# Patient Record
Sex: Male | Born: 1990 | Race: Black or African American | Hispanic: No | Marital: Single | Smoking: Former smoker
Health system: Southern US, Community
[De-identification: ages and names within clinical notes are randomized; demographics above are authoritative.]

## PROBLEM LIST (undated history)

## (undated) DIAGNOSIS — Z789 Other specified health status: Secondary | ICD-10-CM

## (undated) DIAGNOSIS — Z5900 Homelessness unspecified: Secondary | ICD-10-CM

## (undated) DIAGNOSIS — W19XXXA Unspecified fall, initial encounter: Secondary | ICD-10-CM

## (undated) HISTORY — PX: TONSILLECTOMY: SUR1361

---
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
## 2009-05-18 ENCOUNTER — Emergency Department (HOSPITAL_COMMUNITY): Admission: EM | Admit: 2009-05-18 | Discharge: 2009-05-18 | Payer: Self-pay | Admitting: Emergency Medicine

## 2010-05-27 ENCOUNTER — Emergency Department (HOSPITAL_COMMUNITY)
Admission: EM | Admit: 2010-05-27 | Discharge: 2010-05-27 | Payer: Self-pay | Source: Home / Self Care | Admitting: Family Medicine

## 2011-08-29 ENCOUNTER — Encounter (HOSPITAL_COMMUNITY): Payer: Self-pay | Admitting: Emergency Medicine

## 2011-08-29 ENCOUNTER — Emergency Department (HOSPITAL_COMMUNITY)
Admission: EM | Admit: 2011-08-29 | Discharge: 2011-08-29 | Disposition: A | Discharge status: Home / Self Care | Payer: Self-pay | Attending: Emergency Medicine | Admitting: Emergency Medicine

## 2011-08-29 ENCOUNTER — Emergency Department (HOSPITAL_COMMUNITY): Payer: Self-pay

## 2011-08-29 DIAGNOSIS — R05 Cough: Secondary | ICD-10-CM | POA: Insufficient documentation

## 2011-08-29 DIAGNOSIS — J069 Acute upper respiratory infection, unspecified: Secondary | ICD-10-CM

## 2011-08-29 DIAGNOSIS — J3489 Other specified disorders of nose and nasal sinuses: Secondary | ICD-10-CM | POA: Insufficient documentation

## 2011-08-29 DIAGNOSIS — F17200 Nicotine dependence, unspecified, uncomplicated: Secondary | ICD-10-CM | POA: Insufficient documentation

## 2011-08-29 DIAGNOSIS — R059 Cough, unspecified: Secondary | ICD-10-CM | POA: Insufficient documentation

## 2011-08-29 MED ORDER — IBUPROFEN 800 MG PO TABS
800.0000 mg | ORAL_TABLET | Freq: Once | ORAL | Status: AC
  Administered 2011-08-29: 800 mg via ORAL
  Filled 2011-08-29: qty 1

## 2011-08-29 MED ORDER — AZITHROMYCIN 250 MG PO TABS: 250.0000 mg | ORAL_TABLET | Freq: Every day | ORAL | Status: AC

## 2011-08-29 NOTE — ED Notes (Signed)
Pt c/o cough x4days. States he coughs so much he vomits. Vomited 1x today. Pos hx for smoking cigarettes and marijuana.

## 2011-08-29 NOTE — ED Notes (Signed)
Pt d/c home in NAD. Pt verbalized understanding of d/c instructions.

## 2011-08-29 NOTE — ED Notes (Signed)
Pt c/o productive cough (green) x's 4 days.  St's he coughs until he vomits

## 2011-08-29 NOTE — ED Provider Notes (Signed)
History     CSN: [id number]  Arrival date & time 08/29/11  1742   None     Chief Complaint  Patient presents with  . Cough    (Consider location/radiation/quality/duration/timing/severity/associated sxs/prior treatment) Patient is a 21 y.o. male presenting with cough. The history is provided by the patient. No language interpreter was used.  Cough This is a new problem. Associated symptoms include rhinorrhea. Pertinent negatives include no chest pain, no chills, no ear congestion, no ear pain, no headaches, no sore throat, no shortness of breath, no wheezing and no eye redness. His past medical history is significant for pneumonia. His past medical history does not include bronchitis, bronchiectasis, COPD, emphysema or asthma.  Lives in a transitional house with 8 other guys and one of them is sick also.  States he had nausea vomiting and diarrhea last week. States this week he's had a productive cough x4 days. States he also has a runny nose and sneezing. Denies neck pain headache. Afebrile. Patient is a smoker. Prior pneumonia. Post tussive emesis.   History reviewed. No pertinent past medical history.  History reviewed. No pertinent past surgical history.  No family history on file.  History  Substance Use Topics  . Smoking status: Current Everyday Smoker  . Smokeless tobacco: Not on file  . Alcohol Use: No      Review of Systems  Constitutional: Negative for chills.  HENT: Positive for rhinorrhea. Negative for ear pain and sore throat.   Eyes: Negative for redness.  Respiratory: Positive for cough. Negative for chest tightness, shortness of breath and wheezing.   Cardiovascular: Negative for chest pain.  Neurological: Negative for headaches.    Allergies  Review of patient's allergies indicates no known allergies.  Home Medications  No current outpatient prescriptions on file.  BP 131/77  Pulse 81  Temp(Src) 98.6 F (37 C) (Oral)  Resp 16  SpO2  100%  Physical Exam  Nursing note and vitals reviewed. Constitutional: He is oriented to person, place, and time. He appears well-developed and well-nourished.  HENT:  Head: Normocephalic.  Eyes: Conjunctivae and EOM are normal. Pupils are equal, round, and reactive to light.  Neck: Normal range of motion. Neck supple.  Cardiovascular: Normal rate.   Pulmonary/Chest: Effort normal and breath sounds normal. No respiratory distress. He has no wheezes.  Abdominal: Soft.  Musculoskeletal: Normal range of motion.  Neurological: He is alert and oriented to person, place, and time.  Skin: Skin is warm and dry.  Psychiatric: He has a normal mood and affect.    ED Course  Procedures (including critical care time)  Labs Reviewed - No data to display Dg Chest 2 View  08/29/2011  *RADIOLOGY REPORT*  Clinical Data: History of cough, vomiting and fever.  CHEST - 2 VIEW  Comparison: Chest x-ray 05/18/2009.  Findings: There is a ill-defined opacity at the base of the left hemithorax projecting somewhat anteriorly on the lateral view (however, it appears to be immediately posterior to the left major fissure).  This corresponds with an area of airspace consolidation from remote prior chest x-ray 05/18/2009.  Lungs are otherwise clear.  No definite pleural effusions.  Pulmonary vasculature is normal.  Cardiomediastinal silhouette is within normal limits.  IMPRESSION: 1.  Ill-defined opacity projecting at the base of the left hemithorax likely within the anterobasal segment of the left lower lobe.  This could represent an area of scarring from prior pneumonia, however, an acute infection in this region could have a similar appearance.  Clinical correlation is recommended. Additionally, repeat radiographs in 2-3 months are recommended to evaluate for stability or resolution of this finding (to exclude a central obstructing lesion).  Original Report Authenticated By: Florencia Reasons, M.D.     No diagnosis  found.    MDM  z-pack rx given if not better in 1-2 days for cough and upper respiratory symptoms.  pmh pneumonia scarring on the chest x-ray. Afebrile. Return if worse. Nopcp.         Jethro Bastos, NP 08/30/11 1149

## 2011-08-29 NOTE — Discharge Instructions (Signed)
George Harrison your lungs sound clear.  If you start running a fever or the cough continues for several days then get the rx filled and take all the the medication as directed.  Gargle with warm salt water for your throat irritation.  Take benadryl at night for your runny nose.  Take ibuprofen 600mg  every 4-6 hours x 24 hours. Follow up with a pcp of your choice or one  from the list below as needed.  Return for severe SOB or chest pain   RESOURCE GUIDE  Dental Problems  Patients with Medicaid: [facility] [phone] [address]                                           [id number] [employer] Phone:  [phone]                                                  Phone:  [phone]  If unable to pay or uninsured, contact:  [facility] or [facility] to become qualified for the adult dental clinic.  Chronic Pain Problems Contact [facility]  [phone] Patients need to be referred by their primary care doctor.  Insufficient Money for Medicine Contact [employer] Way:  call "211" or [facility] 715-238-8357.  No Primary Care Doctor Call [facility]  [phone] Other agencies that provide inexpensive medical care    [facility]  [phone]    [facility]  [phone]    [facility]  [phone]    [facility]  [phone]    [facility]anned [facility]  [phone]    [facility]  [phone]  Psychological Services [facility]  [phone] [facility]  [phone] [facility]   [phone] (emergency services 815-579-7126)  Substance Abuse Resources [facility]  (602) 683-8470 [facility] 708-281-4981 [facility] [phone] Floydene Flock 937-194-3230 Residential & Outpatient Substance Abuse Program  7850385643  Abuse/Neglect [facility] Child Abuse Hotline (603)512-1376 [facility] Child Abuse Hotline  878-651-3035 (After Hours)  Emergency Shelter [facility] 256-370-0570  Maternity Homes Room at the [place] [facility] [place] [phone] [phone] [facility] ([phone]  MRSA Hotline #:   380-795-4853    [facility] Resources  [facility] [place]     [employer]                          [facility] Dept. [address] [place]                       [address]      [address]  Patrecia Pace  Michell Heinrich Phone:  [phone]                                   Phone:  [phone]                 Phone:  [phone]  [facility] Mental Health Phone:  [phone]  [facility] Child Abuse Hotline 385-846-7097 408-201-5481 (After Hours)   Cough, Adult  A cough is a reflex that helps clear your throat and airways. It can help heal the body or may be a reaction to an irritated airway. A cough may only last 2 or 3 weeks (acute) or may last more than 8 weeks (chronic).  CAUSES Acute cough:  Viral or bacterial infections.  Chronic cough:  Infections.   Allergies.   Asthma.   Post-nasal drip.   Smoking.   Heartburn or acid reflux.   Some medicines.   Chronic lung problems (COPD).   Cancer.  SYMPTOMS   Cough.   Fever.   Chest pain.   Increased breathing rate.   High-pitched whistling sound when breathing (wheezing).   Colored mucus that you cough up (sputum).  TREATMENT   A bacterial cough may be treated with antibiotic medicine.   A viral cough must run its course and will not respond to antibiotics.   Your caregiver may recommend other treatments if you have a chronic cough.  HOME CARE INSTRUCTIONS   Only take over-the-counter or prescription medicines for pain, discomfort, or fever as directed by your caregiver. Use cough suppressants only as directed by your caregiver.   Use a cold steam vaporizer or  humidifier in your bedroom or home to help loosen secretions.   Sleep in a semi-upright position if your cough is worse at night.   Rest as needed.   Stop smoking if you smoke.  SEEK IMMEDIATE MEDICAL CARE IF:   You have pus in your sputum.   Your cough starts to worsen.   You cannot control your cough with suppressants and are losing sleep.   You begin coughing up blood.   You have difficulty breathing.   You develop pain which is getting worse or is uncontrolled with medicine.   You have a fever.  MAKE SURE YOU:   Understand these instructions.   Will watch your condition.   Will get help right away if you are not doing well or get worse.  Document Released: 11/25/2010 Document Revised: 05/18/2011 Document Reviewed: 11/25/2010 [facility] Patient Information 2012 [place], [place].Cough, Adult  A cough is a reflex that helps clear your throat and airways. It can help heal the body or may be a reaction to an irritated airway. A cough may only last 2 or 3 weeks (acute) or may last more than 8 weeks (chronic).  CAUSES Acute cough:  Viral or bacterial infections.  Chronic cough:  Infections.   Allergies.   Asthma.   Post-nasal drip.   Smoking.   Heartburn or acid reflux.   Some medicines.   Chronic lung problems (COPD).   Cancer.  SYMPTOMS   Cough.   Fever.   Chest pain.   Increased breathing rate.   High-pitched whistling sound when breathing (wheezing).   Colored mucus that you cough up (sputum).  TREATMENT   A bacterial cough may be treated with antibiotic medicine.   A viral cough must run its course and will not respond to antibiotics.   Your  caregiver may recommend other treatments if you have a chronic cough.  HOME CARE INSTRUCTIONS   Only take over-the-counter or prescription medicines for pain, discomfort, or fever as directed by your caregiver. Use cough suppressants only as directed by your caregiver.   Use a cold steam vaporizer or  humidifier in your bedroom or home to help loosen secretions.   Sleep in a semi-upright position if your cough is worse at night.   Rest as needed.   Stop smoking if you smoke.  SEEK IMMEDIATE MEDICAL CARE IF:   You have pus in your sputum.   Your cough starts to worsen.   You cannot control your cough with suppressants and are losing sleep.   You begin coughing up blood.   You have difficulty breathing.   You develop pain which is getting worse or is uncontrolled with medicine.   You have a fever.  MAKE SURE YOU:   Understand these instructions.   Will watch your condition.   Will get help right away if you are not doing well or get worse.  Document Released: 11/25/2010 Document Revised: 05/18/2011 Document Reviewed: 11/25/2010 [facility] Patient Information 2012 [place], [place].Cough, Adult  A cough is a reflex that helps clear your throat and airways. It can help heal the body or may be a reaction to an irritated airway. A cough may only last 2 or 3 weeks (acute) or may last more than 8 weeks (chronic).  CAUSES Acute cough:  Viral or bacterial infections.  Chronic cough:  Infections.   Allergies.   Asthma.   Post-nasal drip.   Smoking.   Heartburn or acid reflux.   Some medicines.   Chronic lung problems (COPD).   Cancer.  SYMPTOMS   Cough.   Fever.   Chest pain.   Increased breathing rate.   High-pitched whistling sound when breathing (wheezing).   Colored mucus that you cough up (sputum).  TREATMENT   A bacterial cough may be treated with antibiotic medicine.   A viral cough must run its course and will not respond to antibiotics.   Your caregiver may recommend other treatments if you have a chronic cough.  HOME CARE INSTRUCTIONS   Only take over-the-counter or prescription medicines for pain, discomfort, or fever as directed by your caregiver. Use cough suppressants only as directed by your caregiver.   Use a cold steam vaporizer or  humidifier in your bedroom or home to help loosen secretions.   Sleep in a semi-upright position if your cough is worse at night.   Rest as needed.   Stop smoking if you smoke.  SEEK IMMEDIATE MEDICAL CARE IF:   You have pus in your sputum.   Your cough starts to worsen.   You cannot control your cough with suppressants and are losing sleep.   You begin coughing up blood.   You have difficulty breathing.   You develop pain which is getting worse or is uncontrolled with medicine.   You have a fever.  MAKE SURE YOU:   Understand these instructions.   Will watch your condition.   Will get help right away if you are not doing well or get worse.  Document Released: 11/25/2010 Document Revised: 05/18/2011 Document Reviewed: 11/25/2010 [facility] Patient Information 2012 [place], [place].Cough, Adult  A cough is a reflex that helps clear your throat and airways. It can help heal the body or may be a reaction to an irritated airway. A cough may only last 2 or 3 weeks (acute) or  may last more than 8 weeks (chronic).  CAUSES Acute cough:  Viral or bacterial infections.  Chronic cough:  Infections.   Allergies.   Asthma.   Post-nasal drip.   Smoking.   Heartburn or acid reflux.   Some medicines.   Chronic lung problems (COPD).   Cancer.  SYMPTOMS   Cough.   Fever.   Chest pain.   Increased breathing rate.   High-pitched whistling sound when breathing (wheezing).   Colored mucus that you cough up (sputum).  TREATMENT   A bacterial cough may be treated with antibiotic medicine.   A viral cough must run its course and will not respond to antibiotics.   Your caregiver may recommend other treatments if you have a chronic cough.  HOME CARE INSTRUCTIONS   Only take over-the-counter or prescription medicines for pain, discomfort, or fever as directed by your caregiver. Use cough suppressants only as directed by your caregiver.   Use a cold steam vaporizer or  humidifier in your bedroom or home to help loosen secretions.   Sleep in a semi-upright position if your cough is worse at night.   Rest as needed.   Stop smoking if you smoke.  SEEK IMMEDIATE MEDICAL CARE IF:   You have pus in your sputum.   Your cough starts to worsen.   You cannot control your cough with suppressants and are losing sleep.   You begin coughing up blood.   You have difficulty breathing.   You develop pain which is getting worse or is uncontrolled with medicine.   You have a fever.  MAKE SURE YOU:   Understand these instructions.   Will watch your condition.   Will get help right away if you are not doing well or get worse.  Document Released: 11/25/2010 Document Revised: 05/18/2011 Document Reviewed: 11/25/2010 [facility] Patient Information 2012 [place], [place].

## 2011-08-30 NOTE — ED Provider Notes (Signed)
Medical screening examination/treatment/procedure(s) were performed by non-physician practitioner and as supervising physician I was immediately available for consultation/collaboration.  Enda Santo, MD 08/30/11 1510 

## 2012-07-14 ENCOUNTER — Emergency Department (HOSPITAL_COMMUNITY)
Admission: EM | Admit: 2012-07-14 | Discharge: 2012-07-14 | Disposition: A | Discharge status: Home / Self Care | Payer: Self-pay | Attending: Emergency Medicine | Admitting: Emergency Medicine

## 2012-07-14 ENCOUNTER — Encounter (HOSPITAL_COMMUNITY): Payer: Self-pay | Admitting: Nurse Practitioner

## 2012-07-14 DIAGNOSIS — R112 Nausea with vomiting, unspecified: Secondary | ICD-10-CM

## 2012-07-14 DIAGNOSIS — F17200 Nicotine dependence, unspecified, uncomplicated: Secondary | ICD-10-CM | POA: Insufficient documentation

## 2012-07-14 LAB — COMPREHENSIVE METABOLIC PANEL
Alkaline Phosphatase: 56 U/L (ref 39–117)
BUN: 11 mg/dL (ref 6–23)
CO2: 30 mEq/L (ref 19–32)
Chloride: 101 mEq/L (ref 96–112)
Creatinine, Ser: 0.69 mg/dL (ref 0.50–1.35)
GFR calc non Af Amer: >90 mL/min (ref >90)
Glucose, Bld: 95 mg/dL (ref 70–99)
Potassium: 4 mEq/L (ref 3.5–5.1)
Total Bilirubin: 0.7 mg/dL (ref 0.3–1.2)

## 2012-07-14 LAB — LIPASE, BLOOD: Lipase: 21 U/L (ref 11–59)

## 2012-07-14 LAB — CBC WITH DIFFERENTIAL/PLATELET
HCT: 47.3 % (ref 39.0–52.0)
Hemoglobin: 16.2 g/dL (ref 13.0–17.0)
Lymphocytes Relative: 5 % — ABNORMAL LOW (ref 12–46)
Lymphs Abs: 0.6 10*3/uL — ABNORMAL LOW (ref 0.7–4.0)
MCHC: 34.2 g/dL (ref 30.0–36.0)
Monocytes Absolute: 0.6 10*3/uL (ref 0.1–1.0)
Monocytes Relative: 6 % (ref 3–12)
Neutro Abs: 9.6 10*3/uL — ABNORMAL HIGH (ref 1.7–7.7)
RBC: 5.31 MIL/uL (ref 4.22–5.81)
WBC: 10.8 10*3/uL — ABNORMAL HIGH (ref 4.0–10.5)

## 2012-07-14 MED ORDER — PROMETHAZINE HCL 25 MG PO TABS: 25.0000 mg | ORAL_TABLET | Freq: Four times a day (QID) | ORAL | Status: DC | PRN

## 2012-07-14 MED ORDER — ONDANSETRON HCL 4 MG/2ML IJ SOLN
4.0000 mg | Freq: Once | INTRAMUSCULAR | Status: AC
  Administered 2012-07-14: 4 mg via INTRAVENOUS
  Filled 2012-07-14: qty 8

## 2012-07-14 NOTE — ED Notes (Signed)
Patient state that he has been vomiting since 0400 this AM.  States that he vomited  5 or 6 times so far.  Nausea is constant. Patient states that initially his vomitus contained food, but now it is bilious. Denies diarrhea.

## 2012-07-14 NOTE — ED Notes (Signed)
Patient to [facility] Ed with C/o Nausea and vomiting since 0400.

## 2012-07-14 NOTE — ED Provider Notes (Signed)
History     CSN: [id number]  Arrival date & time 07/14/12  1429   First MD Initiated Contact with Patient 07/14/12 1504      Chief Complaint  Patient presents with  . Nausea    (Consider location/radiation/quality/duration/timing/severity/associated sxs/prior treatment) The history is provided by the patient and medical records.    George Harrison is a 22 y.o. male  with no major medical history of presents to the Emergency Department complaining of gradual, persistent, gradually resolving nausea and vomiting onset 4 AM.  Patient states she has not had much food in the hospital last 2 weeks and last night his sister arrived with lots of food. He states he ate pizza, and kidney beans and Pinto beans along with many other things.  He states he ate until his stomach hurt. He then went to bed and awoke at 4 AM with nausea and vomiting. Associated symptoms include feeling hot.  Nothing makes it better and nothing makes it worse.  Pt denies fever, chills, headache, neck pain, chest pain, shortness of breath, abdominal pain, diarrhea, weakness, dizziness, syncope, dysuria, hematuria, penile discharge.     History reviewed. No pertinent past medical history.  History reviewed. No pertinent past surgical history.  History reviewed. No pertinent family history.  History  Substance Use Topics  . Smoking status: Current Every Day Smoker  . Smokeless tobacco: Not on file  . Alcohol Use: Yes      Review of Systems  Constitutional: Negative for fever, diaphoresis, appetite change, fatigue and unexpected weight change.  HENT: Negative for mouth sores, trouble swallowing, neck pain and neck stiffness.   Respiratory: Negative for cough, chest tightness, shortness of breath, wheezing and stridor.   Cardiovascular: Negative for chest pain and palpitations.  Gastrointestinal: Positive for nausea and vomiting. Negative for abdominal pain, diarrhea, constipation, blood in stool, abdominal distention  and rectal pain.  Genitourinary: Negative for dysuria, urgency, frequency, hematuria, flank pain and difficulty urinating.  Musculoskeletal: Negative for back pain.  Skin: Negative for rash.  Neurological: Negative for weakness.  Hematological: Negative for adenopathy.  Psychiatric/Behavioral: Negative for confusion.  All other systems reviewed and are negative.    Allergies  Review of patient's allergies indicates no known allergies.  Home Medications   Current Outpatient Rx  Name  Route  Sig  Dispense  Refill  . PROMETHAZINE HCL 25 MG PO TABS   Oral   Take 1 tablet (25 mg total) by mouth every 6 (six) hours as needed for nausea.   12 tablet   0     BP 132/72  Pulse 61  Temp 98.9 F (37.2 C) (Oral)  Resp 16  SpO2 100%  Physical Exam  Nursing note and vitals reviewed. Constitutional: He is oriented to person, place, and time. He appears well-developed and well-nourished.  HENT:  Head: Normocephalic and atraumatic.  Mouth/Throat: Oropharynx is clear and moist.  Eyes: Conjunctivae normal and EOM are normal. Pupils are equal, round, and reactive to light. No scleral icterus.  Neck: Normal range of motion.  Cardiovascular: Normal rate, regular rhythm, normal heart sounds and intact distal pulses.  Exam reveals no gallop and no friction rub.   No murmur heard. Pulmonary/Chest: Effort normal and breath sounds normal. No respiratory distress. He has no wheezes. He has no rales. He exhibits no tenderness.  Abdominal: Soft. Normal appearance and bowel sounds are normal. He exhibits no distension and no mass. There is no tenderness. There is no rigidity, no rebound, no guarding,  no CVA tenderness, no tenderness at McBurney's point and negative Murphy's sign.  Musculoskeletal: Normal range of motion. He exhibits no edema and no tenderness.  Lymphadenopathy:    He has no cervical adenopathy.  Neurological: He is alert and oriented to person, place, and time. He exhibits normal  muscle tone. Coordination normal.  Skin: Skin is warm and dry. No rash noted. No erythema.  Psychiatric: He has a normal mood and affect.    ED Course  Procedures (including critical care time)  Labs Reviewed  CBC WITH DIFFERENTIAL - Abnormal; Notable for the following:    WBC 10.8 (*)     Neutrophils Relative 89 (*)     Neutro Abs 9.6 (*)     Lymphocytes Relative 5 (*)     Lymphs Abs 0.6 (*)     All other components within normal limits  COMPREHENSIVE METABOLIC PANEL  LIPASE, BLOOD   No results found.   1. Nausea and vomiting in adult       MDM  [name] presents with nausea and vomiting.  Patient given Zofran and fluid bolus. Patient afebrile, alert and oriented, NAD, nontoxic, nonseptic appearing. Patient normotensive.  Likely viral gastritis versus significant overeating. Will obtain labs to assess for other etiologies such as pancreatitis.    Labs unremarkable. Patient vitals remained stable here in the department.  Fluid bolus given.  Labs and vitals reviewed. Pt requesting d/c home.    BP 132/72  Pulse 61  Temp 98.9 F (37.2 C) (Oral)  Resp 16  SpO2 100%  Patient with symptoms consistent with viral gastritis.  Vitals are stable, no fever.  Patient is nontoxic, nonseptic appearing, in no apparent distress.  Patient does not meet the SIRS or Sepsis criteria.  Pt's symptoms have been managed in the department; fluid bolus given. No signs of dehydration, tolerating PO fluids > 6 oz.  Lungs are clear.  No focal abdominal pain, no peritoneal signs, no concern for appendicitis, cholecystitis, pancreatitis, ruptured viscus, UTI, kidney stone, or any other abdominal etiology.  Supportive therapy indicated with return if symptoms worsen.  Patient counseled.  I have also discussed reasons to return immediately to the ER.  Patient expresses understanding and agrees with plan.  1. Medications: Phenergan, usual home medications  2. Treatment: rest, drink plenty of fluids,  advance diet slowly  3. Follow Up: Please followup with your primary doctor for discussion of your diagnoses and further evaluation after today's visit; if you do not have a primary care doctor use the resource guide provided to find one;   Dierdre Forth, PA-C 07/14/12 1725

## 2012-07-15 NOTE — ED Provider Notes (Signed)
Medical screening examination/treatment/procedure(s) were performed by non-physician practitioner and as supervising physician I was immediately available for consultation/collaboration.  Doug Sou, MD 07/15/12 [phone]

## 2012-10-02 ENCOUNTER — Encounter (HOSPITAL_COMMUNITY): Payer: Self-pay | Admitting: *Deleted

## 2012-10-02 ENCOUNTER — Inpatient Hospital Stay (HOSPITAL_COMMUNITY): Payer: Self-pay

## 2012-10-02 ENCOUNTER — Emergency Department (HOSPITAL_COMMUNITY): Payer: Self-pay

## 2012-10-02 ENCOUNTER — Encounter (HOSPITAL_COMMUNITY): Admission: EM | Disposition: A | Discharge status: Home / Self Care | Payer: Self-pay | Source: Home / Self Care

## 2012-10-02 ENCOUNTER — Inpatient Hospital Stay (HOSPITAL_COMMUNITY): Payer: Self-pay | Admitting: *Deleted

## 2012-10-02 ENCOUNTER — Inpatient Hospital Stay (HOSPITAL_COMMUNITY)
Admission: EM | Admit: 2012-10-02 | Discharge: 2012-10-11 | DRG: 493 | Disposition: A | Discharge status: Home / Self Care | Payer: MEDICAID | Attending: Surgery | Admitting: Surgery

## 2012-10-02 DIAGNOSIS — D62 Acute posthemorrhagic anemia: Secondary | ICD-10-CM

## 2012-10-02 DIAGNOSIS — R7309 Other abnormal glucose: Secondary | ICD-10-CM | POA: Diagnosis present

## 2012-10-02 DIAGNOSIS — S3210XA Unspecified fracture of sacrum, initial encounter for closed fracture: Secondary | ICD-10-CM

## 2012-10-02 DIAGNOSIS — S82899A Other fracture of unspecified lower leg, initial encounter for closed fracture: Secondary | ICD-10-CM

## 2012-10-02 DIAGNOSIS — S329XXA Fracture of unspecified parts of lumbosacral spine and pelvis, initial encounter for closed fracture: Secondary | ICD-10-CM

## 2012-10-02 DIAGNOSIS — S82871A Displaced pilon fracture of right tibia, initial encounter for closed fracture: Secondary | ICD-10-CM

## 2012-10-02 DIAGNOSIS — E871 Hypo-osmolality and hyponatremia: Secondary | ICD-10-CM | POA: Diagnosis not present

## 2012-10-02 DIAGNOSIS — F17200 Nicotine dependence, unspecified, uncomplicated: Secondary | ICD-10-CM | POA: Diagnosis present

## 2012-10-02 DIAGNOSIS — S32509A Unspecified fracture of unspecified pubis, initial encounter for closed fracture: Secondary | ICD-10-CM | POA: Diagnosis present

## 2012-10-02 DIAGNOSIS — Y9289 Other specified places as the place of occurrence of the external cause: Secondary | ICD-10-CM

## 2012-10-02 DIAGNOSIS — S32029A Unspecified fracture of second lumbar vertebra, initial encounter for closed fracture: Secondary | ICD-10-CM

## 2012-10-02 DIAGNOSIS — S82872A Displaced pilon fracture of left tibia, initial encounter for closed fracture: Secondary | ICD-10-CM

## 2012-10-02 DIAGNOSIS — S92009A Unspecified fracture of unspecified calcaneus, initial encounter for closed fracture: Secondary | ICD-10-CM | POA: Diagnosis present

## 2012-10-02 DIAGNOSIS — F1210 Cannabis abuse, uncomplicated: Secondary | ICD-10-CM | POA: Diagnosis present

## 2012-10-02 DIAGNOSIS — S32599A Other specified fracture of unspecified pubis, initial encounter for closed fracture: Secondary | ICD-10-CM

## 2012-10-02 DIAGNOSIS — W19XXXA Unspecified fall, initial encounter: Secondary | ICD-10-CM

## 2012-10-02 DIAGNOSIS — S322XXA Fracture of coccyx, initial encounter for closed fracture: Secondary | ICD-10-CM | POA: Diagnosis present

## 2012-10-02 DIAGNOSIS — S32009A Unspecified fracture of unspecified lumbar vertebra, initial encounter for closed fracture: Secondary | ICD-10-CM

## 2012-10-02 DIAGNOSIS — S32039A Unspecified fracture of third lumbar vertebra, initial encounter for closed fracture: Secondary | ICD-10-CM

## 2012-10-02 DIAGNOSIS — S82209A Unspecified fracture of shaft of unspecified tibia, initial encounter for closed fracture: Principal | ICD-10-CM | POA: Diagnosis present

## 2012-10-02 DIAGNOSIS — W102XXA Fall (on)(from) incline, initial encounter: Secondary | ICD-10-CM

## 2012-10-02 DIAGNOSIS — S92109A Unspecified fracture of unspecified talus, initial encounter for closed fracture: Secondary | ICD-10-CM | POA: Diagnosis present

## 2012-10-02 DIAGNOSIS — S32019A Unspecified fracture of first lumbar vertebra, initial encounter for closed fracture: Secondary | ICD-10-CM

## 2012-10-02 DIAGNOSIS — S92101A Unspecified fracture of right talus, initial encounter for closed fracture: Secondary | ICD-10-CM

## 2012-10-02 DIAGNOSIS — W1789XA Other fall from one level to another, initial encounter: Secondary | ICD-10-CM | POA: Diagnosis present

## 2012-10-02 HISTORY — DX: Other specified health status: Z78.9

## 2012-10-02 HISTORY — DX: Unspecified fall, initial encounter: W19.XXXA

## 2012-10-02 HISTORY — PX: EXTERNAL FIXATION LEG: SHX1549

## 2012-10-02 LAB — CBC
Hemoglobin: 14.8 g/dL (ref 13.0–17.0)
MCH: 30.8 pg (ref 26.0–34.0)
MCV: 85.2 fL (ref 78.0–100.0)
Platelets: 176 10*3/uL (ref 150–400)
RBC: 4.81 MIL/uL (ref 4.22–5.81)
WBC: 15.2 10*3/uL — ABNORMAL HIGH (ref 4.0–10.5)

## 2012-10-02 LAB — COMPREHENSIVE METABOLIC PANEL
AST: 70 U/L — ABNORMAL HIGH (ref 0–37)
Albumin: 4.2 g/dL (ref 3.5–5.2)
BUN: 11 mg/dL (ref 6–23)
Calcium: 9.6 mg/dL (ref 8.4–10.5)
Creatinine, Ser: 0.75 mg/dL (ref 0.50–1.35)
GFR calc non Af Amer: >90 mL/min (ref >90)
Total Bilirubin: 0.5 mg/dL (ref 0.3–1.2)

## 2012-10-02 LAB — CDS SEROLOGY

## 2012-10-02 LAB — POCT I-STAT, CHEM 8
BUN: 11 mg/dL (ref 6–23)
Calcium, Ion: 1.08 mmol/L — ABNORMAL LOW (ref 1.12–1.23)
Creatinine, Ser: 0.7 mg/dL (ref 0.50–1.35)
Glucose, Bld: 137 mg/dL — ABNORMAL HIGH (ref 70–99)
Hemoglobin: 16 g/dL (ref 13.0–17.0)
TCO2: 21 mmol/L (ref 0–100)

## 2012-10-02 LAB — PROTIME-INR
INR: 1.2 (ref 0.00–1.49)
Prothrombin Time: 15 seconds (ref 11.6–15.2)

## 2012-10-02 SURGERY — EXTERNAL FIXATION, LOWER EXTREMITY
Anesthesia: General | Site: Ankle | Laterality: Bilateral | Wound class: Clean

## 2012-10-02 MED ORDER — PANTOPRAZOLE SODIUM 40 MG IV SOLR
40.0000 mg | Freq: Every day | INTRAVENOUS | Status: DC
  Filled 2012-10-02 (×9): qty 40

## 2012-10-02 MED ORDER — FENTANYL CITRATE 0.05 MG/ML IJ SOLN
INTRAMUSCULAR | Status: DC | PRN
  Administered 2012-10-02: 50 ug via INTRAVENOUS

## 2012-10-02 MED ORDER — ONDANSETRON HCL 4 MG/2ML IJ SOLN
INTRAMUSCULAR | Status: DC | PRN
  Administered 2012-10-02: 4 mg via INTRAVENOUS

## 2012-10-02 MED ORDER — MORPHINE SULFATE 2 MG/ML IJ SOLN
2.0000 mg | INTRAMUSCULAR | Status: DC | PRN
  Administered 2012-10-02 – 2012-10-03 (×2): 2 mg via INTRAVENOUS
  Filled 2012-10-02 (×2): qty 1

## 2012-10-02 MED ORDER — NALOXONE HCL 0.4 MG/ML IJ SOLN: 0.4000 mg | INTRAMUSCULAR | Status: DC | PRN

## 2012-10-02 MED ORDER — MORPHINE SULFATE (PF) 1 MG/ML IV SOLN
INTRAVENOUS | Status: DC
  Administered 2012-10-03: 12 mg via INTRAVENOUS
  Administered 2012-10-03: 10 mg via INTRAVENOUS
  Administered 2012-10-03: 10.5 mg via INTRAVENOUS
  Administered 2012-10-03: 6 mg via INTRAVENOUS
  Administered 2012-10-03: 02:00:00 via INTRAVENOUS
  Administered 2012-10-03: 6 mg via INTRAVENOUS
  Administered 2012-10-03 – 2012-10-04 (×2): via INTRAVENOUS
  Administered 2012-10-04: 7.5 mg via INTRAVENOUS
  Administered 2012-10-04: 9 mg via INTRAVENOUS
  Filled 2012-10-02 (×4): qty 25

## 2012-10-02 MED ORDER — MORPHINE SULFATE 4 MG/ML IJ SOLN
6.0000 mg | Freq: Once | INTRAMUSCULAR | Status: AC
  Administered 2012-10-02: 6 mg via INTRAVENOUS
  Filled 2012-10-02: qty 2

## 2012-10-02 MED ORDER — IOHEXOL 300 MG/ML  SOLN
100.0000 mL | Freq: Once | INTRAMUSCULAR | Status: AC | PRN
  Administered 2012-10-02: 100 mL via INTRAVENOUS

## 2012-10-02 MED ORDER — KCL IN DEXTROSE-NACL 20-5-0.45 MEQ/L-%-% IV SOLN
INTRAVENOUS | Status: DC
  Administered 2012-10-02: via INTRAVENOUS
  Administered 2012-10-03: 1000 mL via INTRAVENOUS
  Filled 2012-10-02 (×5): qty 1000

## 2012-10-02 MED ORDER — ONDANSETRON HCL 4 MG/2ML IJ SOLN: 4.0000 mg | Freq: Once | INTRAMUSCULAR | Status: DC | PRN

## 2012-10-02 MED ORDER — PROMETHAZINE HCL 25 MG PO TABS
25.0000 mg | ORAL_TABLET | Freq: Four times a day (QID) | ORAL | Status: DC | PRN
  Administered 2012-10-05: 25 mg via ORAL
  Filled 2012-10-02: qty 1

## 2012-10-02 MED ORDER — SODIUM CHLORIDE 0.9 % IR SOLN
Status: DC | PRN
  Administered 2012-10-02: 1000 mL

## 2012-10-02 MED ORDER — SUCCINYLCHOLINE CHLORIDE 20 MG/ML IJ SOLN
INTRAMUSCULAR | Status: DC | PRN
  Administered 2012-10-02: 100 mg via INTRAVENOUS

## 2012-10-02 MED ORDER — ROCURONIUM BROMIDE 100 MG/10ML IV SOLN
INTRAVENOUS | Status: DC | PRN
  Administered 2012-10-02: 10 mg via INTRAVENOUS

## 2012-10-02 MED ORDER — MORPHINE SULFATE 4 MG/ML IJ SOLN
4.0000 mg | Freq: Once | INTRAMUSCULAR | Status: AC
  Administered 2012-10-02: 4 mg via INTRAVENOUS
  Filled 2012-10-02: qty 1

## 2012-10-02 MED ORDER — LACTATED RINGERS IV SOLN
INTRAVENOUS | Status: DC | PRN
  Administered 2012-10-02 (×2): via INTRAVENOUS

## 2012-10-02 MED ORDER — LIDOCAINE HCL (CARDIAC) 20 MG/ML IV SOLN
INTRAVENOUS | Status: DC | PRN
  Administered 2012-10-02: 100 mg via INTRAVENOUS

## 2012-10-02 MED ORDER — ONDANSETRON HCL 4 MG/2ML IJ SOLN
4.0000 mg | Freq: Four times a day (QID) | INTRAMUSCULAR | Status: DC | PRN
  Administered 2012-10-05: 4 mg via INTRAVENOUS
  Filled 2012-10-02: qty 2

## 2012-10-02 MED ORDER — CEFAZOLIN SODIUM-DEXTROSE 2-3 GM-% IV SOLR
INTRAVENOUS | Status: DC | PRN
  Administered 2012-10-02: 2 g via INTRAVENOUS

## 2012-10-02 MED ORDER — DIPHENHYDRAMINE HCL 12.5 MG/5ML PO ELIX
12.5000 mg | ORAL_SOLUTION | Freq: Four times a day (QID) | ORAL | Status: DC | PRN
  Administered 2012-10-08: 12.5 mg via ORAL
  Filled 2012-10-02: qty 5

## 2012-10-02 MED ORDER — PROPOFOL 10 MG/ML IV BOLUS
INTRAVENOUS | Status: DC | PRN
  Administered 2012-10-02: 200 mg via INTRAVENOUS

## 2012-10-02 MED ORDER — HYDROMORPHONE HCL PF 1 MG/ML IJ SOLN: 0.2500 mg | INTRAMUSCULAR | Status: DC | PRN

## 2012-10-02 MED ORDER — HYDROMORPHONE HCL PF 1 MG/ML IJ SOLN
1.0000 mg | Freq: Once | INTRAMUSCULAR | Status: AC
  Administered 2012-10-02: 1 mg via INTRAVENOUS
  Filled 2012-10-02: qty 1

## 2012-10-02 MED ORDER — SODIUM CHLORIDE 0.9 % IJ SOLN: 9.0000 mL | INTRAMUSCULAR | Status: DC | PRN

## 2012-10-02 MED ORDER — PANTOPRAZOLE SODIUM 40 MG PO TBEC
40.0000 mg | DELAYED_RELEASE_TABLET | Freq: Every day | ORAL | Status: DC
  Administered 2012-10-03 – 2012-10-10 (×6): 40 mg via ORAL
  Filled 2012-10-02 (×6): qty 1

## 2012-10-02 MED ORDER — ONDANSETRON HCL 4 MG PO TABS: 4.0000 mg | ORAL_TABLET | Freq: Four times a day (QID) | ORAL | Status: DC | PRN

## 2012-10-02 MED ORDER — DIPHENHYDRAMINE HCL 50 MG/ML IJ SOLN: 12.5000 mg | Freq: Four times a day (QID) | INTRAMUSCULAR | Status: DC | PRN

## 2012-10-02 MED ORDER — ACETAMINOPHEN 10 MG/ML IV SOLN
INTRAVENOUS | Status: DC | PRN
  Administered 2012-10-02: 1000 mg via INTRAVENOUS

## 2012-10-02 SURGICAL SUPPLY — 67 items
150/50 BONE SCREW ×4 IMPLANT
2 CLAMP 3/5 POST ×8 IMPLANT
5 HOLE CLAMP ×2 IMPLANT
6MM X 350 MM TRANSFIXING SCREW ×2 IMPLANT
9.5MM X 300MM CARBON FIBER ROD ×4 IMPLANT
9.5MM X 400MM CARBON FIBER ROD ×2 IMPLANT
BANDAGE ELASTIC 4 VELCRO ST LF (GAUZE/BANDAGES/DRESSINGS) ×2 IMPLANT
BANDAGE ELASTIC 6 VELCRO ST LF (GAUZE/BANDAGES/DRESSINGS) ×2 IMPLANT
BANDAGE ESMARK 6X9 LF (GAUZE/BANDAGES/DRESSINGS) ×1 IMPLANT
BANDAGE GAUZE ELAST BULKY 4 IN (GAUZE/BANDAGES/DRESSINGS) ×4 IMPLANT
BIT DRILL 3.2X240 A/O LONG (BIT) ×2 IMPLANT
BNDG CMPR 9X6 STRL LF SNTH (GAUZE/BANDAGES/DRESSINGS) ×1
BNDG COHESIVE 6X5 TAN STRL LF (GAUZE/BANDAGES/DRESSINGS) ×2 IMPLANT
BNDG ESMARK 6X9 LF (GAUZE/BANDAGES/DRESSINGS) ×2
CLAMP 2 3/5OST (Clamp) ×4 IMPLANT
CLAMP 5 HOLE (Clamp) ×2 IMPLANT
CLAMP PIN-ROD (Clamp) ×4 IMPLANT
CLAMP ROD-ROD (Clamp) ×4 IMPLANT
CLOTH BEACON ORANGE TIMEOUT ST (SAFETY) ×2 IMPLANT
COVER SURGICAL LIGHT HANDLE (MISCELLANEOUS) ×2 IMPLANT
CUFF TOURNIQUET SINGLE 24IN (TOURNIQUET CUFF) IMPLANT
CUFF TOURNIQUET SINGLE 34IN LL (TOURNIQUET CUFF) IMPLANT
DRAPE C-ARM 42X72 X-RAY (DRAPES) IMPLANT
DRAPE U-SHAPE 47X51 STRL (DRAPES) ×2 IMPLANT
DRSG ADAPTIC 3X8 NADH LF (GAUZE/BANDAGES/DRESSINGS) ×2 IMPLANT
ELECT REM PT RETURN 9FT ADLT (ELECTROSURGICAL) ×2
ELECTRODE REM PT RTRN 9FT ADLT (ELECTROSURGICAL) ×1 IMPLANT
GAUZE XEROFORM 5X9 LF (GAUZE/BANDAGES/DRESSINGS) ×1 IMPLANT
GLOVE BIOGEL PI IND STRL 7.5 (GLOVE) ×1 IMPLANT
GLOVE BIOGEL PI IND STRL 8 (GLOVE) ×1 IMPLANT
GLOVE BIOGEL PI INDICATOR 7.5 (GLOVE) ×1
GLOVE BIOGEL PI INDICATOR 8 (GLOVE) ×1
GLOVE ECLIPSE 7.0 STRL STRAW (GLOVE) ×2 IMPLANT
GLOVE ORTHO TXT STRL SZ7.5 (GLOVE) ×2 IMPLANT
GOWN PREVENTION PLUS LG XLONG (DISPOSABLE) IMPLANT
GOWN STRL NON-REIN LRG LVL3 (GOWN DISPOSABLE) ×4 IMPLANT
HANDPIECE INTERPULSE COAX TIP (DISPOSABLE) ×2
KIT BASIN OR (CUSTOM PROCEDURE TRAY) ×2 IMPLANT
KIT ROOM TURNOVER OR (KITS) ×2 IMPLANT
MANIFOLD NEPTUNE II (INSTRUMENTS) ×2 IMPLANT
NS IRRIG 1000ML POUR BTL (IV SOLUTION) ×2 IMPLANT
PACK ORTHO EXTREMITY (CUSTOM PROCEDURE TRAY) ×2 IMPLANT
PAD ARMBOARD 7.5X6 YLW CONV (MISCELLANEOUS) ×4 IMPLANT
PADDING CAST COTTON 6X4 STRL (CAST SUPPLIES) ×6 IMPLANT
PENCIL BUTTON HOLSTER BLD 10FT (ELECTRODE) IMPLANT
ROD CARBON FIBER 300X9.5MM (Rod) ×2 IMPLANT
ROD CARBON FIBER 9.5X400MM (Rod) ×2 IMPLANT
SCREW TRANSFIXING 6X350MM (Screw) ×4 IMPLANT
SET HNDPC FAN SPRY TIP SCT (DISPOSABLE) ×1 IMPLANT
SLM RAPID 4-6 MM PIN TO ROD CLAMP ×4 IMPLANT
SLM RAPID ROD TO ROD CLAMP ×4 IMPLANT
SPONGE GAUZE 4X4 12PLY (GAUZE/BANDAGES/DRESSINGS) ×2 IMPLANT
SPONGE LAP 18X18 X RAY DECT (DISPOSABLE) ×2 IMPLANT
SPONGE SCRUB IODOPHOR (GAUZE/BANDAGES/DRESSINGS) ×2 IMPLANT
STOCKINETTE IMPERVIOUS LG (DRAPES) ×2 IMPLANT
SUT ETHILON 3 0 PS 1 (SUTURE) IMPLANT
SUT VIC AB 0 CT1 27 (SUTURE) ×4
SUT VIC AB 0 CT1 27XBRD ANBCTR (SUTURE) ×2 IMPLANT
SUT VIC AB 2-0 CT1 27 (SUTURE) ×4
SUT VIC AB 2-0 CT1 TAPERPNT 27 (SUTURE) ×2 IMPLANT
TAPE CLOTH SURG 4X10 WHT LF (GAUZE/BANDAGES/DRESSINGS) ×2 IMPLANT
TOWEL OR 17X24 6PK STRL BLUE (TOWEL DISPOSABLE) ×4 IMPLANT
TOWEL OR 17X26 10 PK STRL BLUE (TOWEL DISPOSABLE) ×2 IMPLANT
TUBE CONNECTING 12X1/4 (SUCTIONS) IMPLANT
UNDERPAD 30X30 INCONTINENT (UNDERPADS AND DIAPERS) ×2 IMPLANT
WATER STERILE IRR 1000ML POUR (IV SOLUTION) ×4 IMPLANT
YANKAUER SUCT BULB TIP NO VENT (SUCTIONS) IMPLANT

## 2012-10-02 NOTE — ED Notes (Signed)
1 EBT card, 2 packs of cigarettes, and $71 in cash placed in ED safe per security
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
## 2012-10-02 NOTE — ED Notes (Signed)
16 french indwelling foley catheter inserted without difficulty; 100 cc cloudy yellow urine obtained in drainage bag; tubing secured to left upper thigh; pt tolerated procedure well

## 2012-10-02 NOTE — ED Notes (Signed)
Pt remains in CT scan - trauma service has seen pt while in scan - continues to wait for pt's arrival back to dept - over to CT to assess - pt resting quietly on table getting plain films; remains in supine position with EMS collar intact - awake, alert

## 2012-10-02 NOTE — Transfer of Care (Signed)
Immediate Anesthesia Transfer of Care Note  Patient: George Harrison  Procedure(s) Performed: Procedure(s): Application of bilateral external fixators Across Ankles (Bilateral)  Patient Location: PACU  Anesthesia Type:General  Level of Consciousness: awake, alert  and oriented  Airway & Oxygen Therapy: Patient Spontanous Breathing and Patient connected to nasal cannula oxygen  Post-op Assessment: Report given to PACU RN and Post -op Vital signs reviewed and stable  Post vital signs: Reviewed and stable  Complications: No apparent anesthesia complications

## 2012-10-02 NOTE — ED Notes (Signed)
Transported to OR via stretcher on 4L O2/[place]

## 2012-10-02 NOTE — ED Notes (Signed)
Remains in CT scan

## 2012-10-02 NOTE — ED Notes (Signed)
Transported to [facility] via stretcher per rad tech
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
## 2012-10-02 NOTE — Preoperative (Signed)
Beta Blockers   Reason not to administer Beta Blockers:Not Applicable. No home beta blockers 

## 2012-10-02 NOTE — Interval H&P Note (Signed)
History and Physical Interval Note:  10/02/2012 7:40 PM  George Harrison  has presented today for surgery, with the diagnosis of Bilateral lower extremity fractures  The various methods of treatment have been discussed with the patient and family. After consideration of risks, benefits and other options for treatment, the patient has consented to  Procedure(s) with comments: Application of bilateral external fixators (Bilateral) - Biomet External Fixators as a surgical intervention .  The patient's history has been reviewed, patient examined, no change in status, stable for surgery.  I have reviewed the patient's chart and labs.  Questions were answered to the patient's satisfaction.     Tatsuo Musial C

## 2012-10-02 NOTE — Brief Op Note (Signed)
10/02/2012  9:22 PM  PATIENT:  George Harrison  22 y.o. male  PRE-OPERATIVE DIAGNOSIS:  Bilateral lower extremity fractures  POST-OPERATIVE DIAGNOSIS:  Bilateral lower extremity fractures  PROCEDURE:  Procedure(s): Application of bilateral external fixators Across Ankles (Bilateral) reduction of both ankles and fixator application ( Tibial and calcaneus pins. )  SURGEON:  Surgeon(s) and Role:    * Eldred Manges, MD - Primary  PHYSICIAN ASSISTANT: 2  ASSISTANTS: none   ANESTHESIA:   general  EBL:  Total I/O In: 1600 [I.V.:1600] Out: 420 [Urine:420]  BLOOD ADMINISTERED:none  DRAINS: none   LOCAL MEDICATIONS USED:  NONE  SPECIMEN:  No Specimen  DISPOSITION OF SPECIMEN:  N/A  COUNTS:  YES  TOURNIQUET:  * No tourniquets in log *  DICTATION: .Other Dictation: Dictation Number [id number]  PLAN OF CARE: Admit to inpatient   PATIENT DISPOSITION:  PACU - hemodynamically stable.   Delay start of Pharmacological VTE agent (>24hrs) due to surgical blood loss or risk of bleeding: pelvic fractures, lumbar fractures with retropulsion -no chem prophylaxis for now.
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
## 2012-10-02 NOTE — ED Notes (Signed)
o2 sats dropping to 70% on RA after administeration of Dilaudid; placed on 4L/[place]; o2 sats increasing to 100%

## 2012-10-02 NOTE — Progress Notes (Signed)
10/02/12 1800  Clinical Encounter Type  Visited With Patient;Health care provider George Herter, RN; present as Dr George Harrison shared neuro assessment)  Visit Type Follow-up;Trauma  Referral From Chaplain George Harrison)  Recommendations Spiritual Care will follow for support.  Spiritual Encounters  Spiritual Needs Emotional  Stress Factors  Patient Stress Factors Loss of control;Major life changes   Referred by George Harrison to help [facility] phone his aunt during her break from work at 5:30.  (Per pt's request, chaplain had tried to reach aunt earlier; he and she both left messages for each other.)  George Harrison phone his aunt George Harrison [phone]).  He left her a voice mail asking her to contact other family members to let them know that he is at [facility].  Provided pastoral presence, listening, and reflection as George Harrison struggled with self-judgment and regret ("I shouldn't have been up on that bridge"), offering some reframing possibilities and also asking his RN to repeat the details of his physical assessment so far in order to help him have an accurate working understanding of treatment and prognosis.  Served as Print production planner between pt and RN to respond to his questions related to his physical needs, restrictions, pain management, and care plan.  George Harrison was very courteous and appreciative.  Will refer to day chaplain for follow-up support.  [address] [place], [place] [phone]

## 2012-10-02 NOTE — Anesthesia Postprocedure Evaluation (Signed)
  Anesthesia Post-op Note  Patient: George Harrison  Procedure(s) Performed: Procedure(s): Application of bilateral external fixators Across Ankles (Bilateral)  Patient Location: PACU  Anesthesia Type:General  Level of Consciousness: awake, sedated and patient cooperative  Airway and Oxygen Therapy: Patient Spontanous Breathing  Post-op Pain: none  Post-op Assessment: Post-op Vital signs reviewed, Patient's Cardiovascular Status Stable, Respiratory Function Stable, Patent Airway, No signs of Nausea or vomiting and Pain level controlled  Post-op Vital Signs: stable  Complications: No apparent anesthesia complications
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
## 2012-10-02 NOTE — ED Notes (Signed)
OP permit signed prior to administering pain meds

## 2012-10-02 NOTE — ED Notes (Signed)
Ortho in to assess pt; Report called to [place], RN on 3100; pt to go to OR prior to floor
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
## 2012-10-02 NOTE — Anesthesia Preprocedure Evaluation (Addendum)
Anesthesia Evaluation  Patient identified by MRN, date of birth, ID band Patient awake    Reviewed: Allergy & Precautions, H&P , NPO status , Patient's Chart, lab work & pertinent test results, reviewed documented beta blocker date and time   Airway Mallampati: I TM Distance: >3 FB Neck ROM: full    Dental  (+) Teeth Intact and Dental Advisory Given   Pulmonary Current Smoker,          Cardiovascular Rhythm:regular Rate:Normal     Neuro/Psych    GI/Hepatic   Endo/Other    Renal/GU      Musculoskeletal   Abdominal   Peds  Hematology   Anesthesia Other Findings   Reproductive/Obstetrics                          Anesthesia Physical Anesthesia Plan  ASA: II  Anesthesia Plan: General   Post-op Pain Management:    Induction: Intravenous  Airway Management Planned: Oral ETT  Additional Equipment:   Intra-op Plan:   Post-operative Plan: Extubation in OR  Informed Consent: I have reviewed the patients History and Physical, chart, labs and discussed the procedure including the risks, benefits and alternatives for the proposed anesthesia with the patient or authorized representative who has indicated his/her understanding and acceptance.   Dental advisory given  Plan Discussed with: CRNA, Anesthesiologist and Surgeon  Anesthesia Plan Comments:        Anesthesia Quick Evaluation

## 2012-10-02 NOTE — ED Notes (Signed)
From xray dept via stretcher per rad tech

## 2012-10-03 ENCOUNTER — Encounter (HOSPITAL_COMMUNITY): Payer: Self-pay | Admitting: Orthopaedic Surgery

## 2012-10-03 ENCOUNTER — Inpatient Hospital Stay (HOSPITAL_COMMUNITY): Payer: Self-pay

## 2012-10-03 LAB — URINALYSIS, MICROSCOPIC ONLY
Glucose, UA: NEGATIVE mg/dL
Ketones, ur: NEGATIVE mg/dL
Leukocytes, UA: NEGATIVE
Nitrite: NEGATIVE
Specific Gravity, Urine: 1.022 (ref 1.005–1.030)
pH: 6.5 (ref 5.0–8.0)

## 2012-10-03 LAB — CBC
MCH: 30.7 pg (ref 26.0–34.0)
MCHC: 36.2 g/dL — ABNORMAL HIGH (ref 30.0–36.0)
MCV: 84.9 fL (ref 78.0–100.0)
Platelets: 122 10*3/uL — ABNORMAL LOW (ref 150–400)
RBC: 4.17 MIL/uL — ABNORMAL LOW (ref 4.22–5.81)

## 2012-10-03 LAB — BASIC METABOLIC PANEL
CO2: 30 mEq/L (ref 19–32)
Calcium: 8.4 mg/dL (ref 8.4–10.5)
Creatinine, Ser: 0.72 mg/dL (ref 0.50–1.35)
GFR calc non Af Amer: >90 mL/min (ref >90)
Glucose, Bld: 138 mg/dL — ABNORMAL HIGH (ref 70–99)

## 2012-10-03 MED ORDER — LORAZEPAM 0.5 MG PO TABS
0.5000 mg | ORAL_TABLET | ORAL | Status: DC | PRN
  Administered 2012-10-05 – 2012-10-11 (×9): 0.5 mg via ORAL
  Filled 2012-10-03 (×9): qty 1
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
## 2012-10-03 NOTE — Progress Notes (Signed)
UR completed 

## 2012-10-03 NOTE — Progress Notes (Signed)
Retrieved patient belongings from [employer]. Belongings given to patient. Patient stated that he wanted to keep his belongings in a bag on the side table.

## 2012-10-03 NOTE — Progress Notes (Signed)
Patient ID: George Harrison, male   DOB: September 15, 1990, 22 y.o.   MRN: [id number] BP 151/85  Pulse 72  Temp(Src) 98.8 F (37.1 C) (Oral)  Resp 15  SpO2 98% Alert and oriented x 4, moving all extremities Not weight bearing. No need for brace at this time.

## 2012-10-04 LAB — CBC
HCT: 33 % — ABNORMAL LOW (ref 39.0–52.0)
Hemoglobin: 11.9 g/dL — ABNORMAL LOW (ref 13.0–17.0)
MCH: 30.3 pg (ref 26.0–34.0)
MCHC: 36.1 g/dL — ABNORMAL HIGH (ref 30.0–36.0)
MCV: 84 fL (ref 78.0–100.0)
Platelets: 99 10*3/uL — ABNORMAL LOW (ref 150–400)
RBC: 3.93 MIL/uL — ABNORMAL LOW (ref 4.22–5.81)
RDW: 13.1 % (ref 11.5–15.5)
WBC: 11.3 10*3/uL — ABNORMAL HIGH (ref 4.0–10.5)

## 2012-10-04 LAB — BASIC METABOLIC PANEL
BUN: 3 mg/dL — ABNORMAL LOW (ref 6–23)
CO2: 28 mEq/L (ref 19–32)
Calcium: 8.6 mg/dL (ref 8.4–10.5)
Chloride: 98 mEq/L (ref 96–112)
Creatinine, Ser: 0.6 mg/dL (ref 0.50–1.35)
GFR calc Af Amer: >90 mL/min (ref >90)
GFR calc non Af Amer: >90 mL/min (ref >90)
Glucose, Bld: 142 mg/dL — ABNORMAL HIGH (ref 70–99)
Potassium: 3.5 mEq/L (ref 3.5–5.1)
Sodium: 132 mEq/L — ABNORMAL LOW (ref 135–145)

## 2012-10-04 MED ORDER — OXYCODONE HCL 5 MG PO TABS
5.0000 mg | ORAL_TABLET | ORAL | Status: DC | PRN
  Administered 2012-10-04: 10 mg via ORAL
  Administered 2012-10-04 – 2012-10-05 (×4): 15 mg via ORAL
  Administered 2012-10-06: 10 mg via ORAL
  Administered 2012-10-06 – 2012-10-07 (×4): 15 mg via ORAL
  Administered 2012-10-10: 10 mg via ORAL
  Administered 2012-10-10: 5 mg via ORAL
  Filled 2012-10-04 (×4): qty 3
  Filled 2012-10-04: qty 2
  Filled 2012-10-04 (×3): qty 3
  Filled 2012-10-04: qty 2
  Filled 2012-10-04 (×3): qty 3

## 2012-10-04 MED ORDER — PNEUMOCOCCAL VAC POLYVALENT 25 MCG/0.5ML IJ INJ
0.5000 mL | INJECTION | INTRAMUSCULAR | Status: AC
  Administered 2012-10-05: 0.5 mL via INTRAMUSCULAR
  Filled 2012-10-04: qty 0.5

## 2012-10-04 MED ORDER — TIZANIDINE HCL 4 MG PO TABS
4.0000 mg | ORAL_TABLET | Freq: Three times a day (TID) | ORAL | Status: DC | PRN
  Administered 2012-10-04 – 2012-10-07 (×4): 4 mg via ORAL
  Filled 2012-10-04 (×4): qty 1

## 2012-10-04 MED ORDER — MORPHINE SULFATE 2 MG/ML IJ SOLN
2.0000 mg | INTRAMUSCULAR | Status: DC | PRN
  Administered 2012-10-04 – 2012-10-05 (×2): 2 mg via INTRAVENOUS
  Filled 2012-10-04 (×2): qty 1

## 2012-10-04 MED ORDER — ENOXAPARIN SODIUM 30 MG/0.3ML ~~LOC~~ SOLN
30.0000 mg | Freq: Two times a day (BID) | SUBCUTANEOUS | Status: DC
  Administered 2012-10-04 – 2012-10-09 (×10): 30 mg via SUBCUTANEOUS
  Filled 2012-10-04 (×13): qty 0.3

## 2012-10-04 NOTE — Progress Notes (Signed)
Patient ID: George Harrison, male   DOB: [birth year], 22 y.o.   MRN: [id number] 2 Days Post-Op  Subjective: Tolerating diet, worried about being here and not taking care of him mother at home  Objective: Vital signs in last 24 hours: Temp:  [98.8 F (37.1 C)-99.3 F (37.4 C)] 99 F (37.2 C) (04/25 0828) Pulse Rate:  [63-94] 88 (04/25 0900) Resp:  [8-19] 18 (04/25 0900) BP: (136-172)/(76-129) 155/90 mmHg (04/25 0900) SpO2:  [97 %-100 %] 100 % (04/25 0900)    Intake/Output from previous day: 04/24 0701 - 04/25 0700 In: 2400 [I.V.:2400] Out: 4515 [Urine:4515] Intake/Output this shift: Total I/O In: 300 [I.V.:300] Out: -   General appearance: alert and cooperative Resp: clear to auscultation bilaterally Cardio: regular rate and rhythm GI: benign Extremities: BLE ex fix, feet and ankles edematous, sensation intact Pulses: BDP 2+ Neurologic: Mental status: Alert, oriented, thought content appropriate Sensory: toes sensate and move  Lab Results: CBC   Recent Labs  10/03/12 0555 10/03/12 1608 10/04/12 0420  WBC 11.0*  --  11.3*  HGB 12.8* 12.4* 11.9*  HCT 35.4* 34.8* 33.0*  PLT 122*  --  99*   BMET  Recent Labs  10/03/12 0555 10/04/12 0420  NA 133* 132*  K 3.9 3.5  CL 98 98  CO2 30 28  GLUCOSE 138* 142*  BUN 6 3*  CREATININE 0.72 0.60  CALCIUM 8.4 8.6   PT/INR  Recent Labs  10/02/12 1442  LABPROT 15.0  INR 1.20   ABG No results found for this basename: PHART, PCO2, PO2, HCO3,  in the last 72 hours  Studies/Results: Dg Elbow 2 Views Right  10/02/2012  *RADIOLOGY REPORT*  Clinical Data: 22 year old male status post trauma.  20 feet fall. Pain.  RIGHT ELBOW - 2 VIEW  Comparison: None.  Findings: Cross-table lateral view.  No elbow joint effusion identified. Bone mineralization is within normal limits.  On the AP view, the elbow is not extended.  Joint spaces and alignment within normal limits.  No acute fracture identified.  IMPRESSION: No acute fracture or  dislocation identified about the right elbow.   Original Report Authenticated By: Erskine Speed, M.D.    Dg Ankle 2 Views Left  10/02/2012  *RADIOLOGY REPORT*  Clinical Data: 22 year old male undergoing external fixation of ankles.  DG C-ARM 1-60 MIN, LEFT ANKLE - 2 VIEW, RIGHT ANKLE - 2 VIEW  Technique: Two intraoperative fluoroscopic views of the ankles.  Fluoroscopy time of 0-minutes-18-seconds was utilized.  Comparison:  Ankle CTs 10/02/2012.  Findings: Severely comminuted bilateral ankle fractures.  Partial visualization of external fixator hardware, better visualized over the left calcaneus.  IMPRESSION: Partial visualization of external fixator hardware about the ankles.   Original Report Authenticated By: Erskine Speed, M.D.    Dg Ankle 2 Views Left  10/02/2012  *RADIOLOGY REPORT*  Clinical Data: Fall.  Pain.  The patient jumped from 20 feet.  LEFT ANKLE - 2 VIEW  Comparison: None.  Findings: A comminuted fracture dislocation is present to.  The distal tibia is fractured.  The talus displaced anteriorly. The fibula is displaced posteriorly.  There may be a talar fracture.  IMPRESSION:  1. Fracture dislocation of the ankle. 2.  Comminuted anterior medial tibial fracture within the displacement the talus. 3.  Dislocation of fibula without definite fracture. 4.  Question posterior talar dome fracture.  Recommend CT of the ankle for further evaluation.   Original Report Authenticated By: Marin Roberts, M.D.    Dg Ankle  2 Views Right  10/02/2012  *RADIOLOGY REPORT*  Clinical Data: 22 year old male undergoing external fixation of ankles.  DG C-ARM 1-60 MIN, LEFT ANKLE - 2 VIEW, RIGHT ANKLE - 2 VIEW  Technique: Two intraoperative fluoroscopic views of the ankles.  Fluoroscopy time of 0-minutes-18-seconds was utilized.  Comparison:  Ankle CTs 10/02/2012.  Findings: Severely comminuted bilateral ankle fractures.  Partial visualization of external fixator hardware, better visualized over the left calcaneus.   IMPRESSION: Partial visualization of external fixator hardware about the ankles.   Original Report Authenticated By: Erskine Speed, M.D.    Dg Ankle 2 Views Right  10/02/2012  *RADIOLOGY REPORT*  Clinical Data: Trauma and pain.  RIGHT ANKLE - 2 VIEW  Comparison: None.  Findings: Artifact degradation on all views.  Moderate soft tissue swelling.  Suboptimal patient positioning. Comminuted distal tibial fracture with extension into the intra-articular surface.  No convincing evidence of fibular fracture; this is not well evaluated.  There is widening of the lateral ankle mortise.  IMPRESSION: Comminuted distal tibial fracture, intra-articular.  Widening of the lateral ankle mortise.  Suboptimal exam secondary patient positioning and overlying artifact.  Recommend further characterization with CT.   Original Report Authenticated By: Jeronimo Greaves, M.D.    Ct Chest Wo Contrast  10/02/2012  *RADIOLOGY REPORT*  Clinical Data: The patient jumped 20 feet from a bridge.  Fall.  CT CHEST WITHOUT CONTRAST  Technique:  Multidetector CT imaging of the chest was performed following the standard protocol without IV contrast.  Comparison: Two-view chest x-ray 08/29/2011.  Findings: The heart size is normal.  The mediastinum is unremarkable.  No significant adenopathy is present.  The lungs are clear.  Rightward curvature of the thoracic spine is stable, the apex is at T9-10.  No acute fracture or subluxation is present in the thoracic spine.  The ribs are intact.  The superior endplate fracture of L1 is again seen with minimal retropulsion of bone.  IMPRESSION:  1.  Stable rightward curvature of the thoracic spine. 2.  No acute trauma to the chest. 3.  Superior endplate compression fracture of L1 with minimal retropulsion of bone.   Original Report Authenticated By: Marin Roberts, M.D.    Ct Cervical Spine Wo Contrast  10/02/2012  *RADIOLOGY REPORT*  Clinical Data: Fall.  Jumped from bridge.  20 foot drop.  CT CERVICAL  SPINE WITHOUT CONTRAST  Technique:  Multidetector CT imaging of the cervical spine was performed. Multiplanar CT image reconstructions were also generated.  Comparison: None.  Findings: The cervical spine is imaged from the skull base through T2-3.  The vertebral body heights and alignment are maintained.  No acute fracture or traumatic subluxation is evident.  The lung apices are clear.  The soft tissues are unremarkable.  IMPRESSION: Negative CT of the cervical spine.   Original Report Authenticated By: Marin Roberts, M.D.    Ct Abdomen Pelvis W Contrast  10/02/2012  *RADIOLOGY REPORT*  Clinical Data: 20 feet fall after jumping off of the bridge to ovoid a train.  CT ABDOMEN AND PELVIS WITH CONTRAST  Technique:  Multidetector CT imaging of the abdomen and pelvis was performed following the standard protocol during bolus administration of intravenous contrast.  Contrast: OMNIPAQUE IOHEXOL 300 MG/ML  SOLN  Comparison: Radiographs from 10/02/2012  Findings: Liver, spleen, pancreas, and adrenal glands appear normal.  1.3 cm left kidney lower pole simple cyst; kidneys otherwise unremarkable.  Trace pericardial fluid noted inferiorly, without a definite pericardial effusion.  Anterior compression fracture node  along the superior endplate at L1 with 2 mm posterior bony retropulsion.  Minimal superior endplate compression fractures noted at L2 and L3 best appreciated on the parasagittal images.  Levoconvex lumbar scoliosis may be positional in nature.  There is spur-like irregularity along the margin of the left femoral head and neck, and to a lesser extent along the margin of the right femoral head and neck.  A vertically oriented parasagittal right sacral fracture extends through the sacral foramina on the right.  Mildly comminuted inferior pubic ramus fracture on the right with a subtle nondisplaced superior pubic ramus fracture extending into the anterior column of the right acetabulum.  Suspected small  avulsion fractures along the margins of the pubic symphysis.  No pubic symphysis widening noted.  There is a moderate amount of anterior extraperitoneal hematoma along the upper and anterior margin of the urinary bladder and extending into the space of Retzius, particularly on the right side in the vicinity of the right sided pelvic fractures.  Trace amount of free pelvic fluid.  Abnormal presacral hematoma/edema noted. There is likely minimal expansion of the right obturator internus and right piriformis muscles due to hematoma.  We do not demonstrate active extravasation of contrast medium to suggest active bleeding at the time of the scan.  No free intraperitoneal gas.  IMPRESSION:  1.  Right pelvic fractures including a vertical fracture through the right sacrum, mildly comminuted right inferior pubic ramus fracture, small a pulsion fractures of the pubic symphysis margins without pubic symphysis widening, and a nondisplaced fracture of the right superior pubic ramus extending into the anterior column of the right acetabulum. 2.  Spurring along the margins of the femoral head and neck bilaterally, left greater than right.  This could well be degenerative and is not necessarily due to fracture; MRI could help differentiate if clinically warranted. 3.  Superior endplate compressions at L1, L2, and L3, with minimal posterior bony retropulsion at the L1 compression fracture. 4.  Trace pericardial fluid noted inferiorly.   Original Report Authenticated By: Gaylyn Rong, M.D.    Ct Ankle Right Wo Contrast  10/02/2012  *RADIOLOGY REPORT*  Clinical Data: Ankle fractures after jumping from a bridge.  CT OF THE RIGHT ANKLE WITH CONTRAST  Technique:  Multidetector CT imaging was performed following the standard protocol during bolus administration of intravenous contrast.  Comparison: Radiographs dated 10/02/2012  Findings: There is a comminuted fracture of the distal tibia with slight posterior subluxation of the  major fragment with respect of the dome of the talus.  There is a spiral component of the fracture which extends 12 cm proximal to the ankle joint.  There are two tiny bone fragments lying anterior to the dorsal aspect of the distal talus.  Talus and calcaneus are intact as is the distal fibula.  On the axial images the posterior tibial tendon and flexor digitorum longus tendon are slightly subluxed into the posterior aspect of the fracture of the articular surface of the distal tibia.  Peroneal tendons, Achilles tendon, anterior tibial tendon appear normal.  IMPRESSION: Comminuted impacted and slightly displaced fractures of the distal tibia.   Original Report Authenticated By: Francene Boyers, M.D.    Dg Pelvis Portable  10/02/2012  *RADIOLOGY REPORT*  Clinical Data: Status post fall.  Pain.  PORTABLE PELVIS  Comparison: None.  Findings: The patient has a mildly impacted subcapital left hip fracture.  No other acute bony or joint abnormality is identified.  IMPRESSION: Mildly impacted subcapital left hip fracture.  Original Report Authenticated By: Holley Dexter, M.D.    Ct Ankle Left Wo Contrast  10/02/2012  *RADIOLOGY REPORT*  Clinical Data: Bilateral ankle fractures after jumping from a bridge.  CT OF THE LEFT ANKLE WITHOUT CONTRAST  Technique:  Multidetector CT imaging was performed according to the standard protocol. Multiplanar CT image reconstructions were also generated.  Comparison: Radiographs dated 10/02/2012  Findings: There is a severely comminuted fracture of the distal tibia with numerous tiny fragments of the articular surface. Medial malleolus is avulsed and comminuted and proximally displaced.  The posterior aspect of the talar dome is disintegrated and the posterior major fragment of the distal tibia is impacted into this area.  There is a comminuted fracture of the anterior aspect of the tibia overlying the dome of the talus.  There is also a fracture of the medial aspect of the talus  with slight displacement.  There is a comminuted avulsion fracture of the posterior-superior lateral aspect of the calcaneus as well as a small avulsion fracture of the posterior medial aspect of the plantar aspect of the calcaneus.  There are numerous tiny bone fragments around the posterior tibialis and flexor digitorum longus tendons at the level of the medial malleolus.  There is a fragment of the medial malleolus which appears impale the posterior tibialis tendon on image number 47 of series 3.  Achilles tendon appears intact.  Peroneal tendons and anterior tibial tendon appear intact.  The tarsal bones distal to the talus and calcaneus are intact.  IMPRESSION: Severely comminuted fracture of the left ankle joint including fractures of the talus and calcaneus.  No fractures of the medial malleolus.  Impingement upon the posterior tibialis and flexor digitorum longus tendon by bone fragments.   Original Report Authenticated By: Francene Boyers, M.D.    Dg Chest Port 1 View  10/03/2012  *RADIOLOGY REPORT*  Clinical Data: Follow-up spine fracture  PORTABLE CHEST - 1 VIEW  Comparison: Yesterday  Findings: Normal heart size.  Clear lungs.  No pneumothorax.  IMPRESSION: Negative.   Original Report Authenticated By: Jolaine Click, M.D.    Dg Chest Portable 1 View  10/02/2012  *RADIOLOGY REPORT*  Clinical Data: Fall 23 feet.  Trauma  PORTABLE CHEST - 1 VIEW  Comparison: 08/29/2011  Findings: Cardiac and mediastinal contours are normal.  Lungs are clear without infiltrate or effusion.  No pneumothorax or rib fracture.  IMPRESSION: Negative   Original Report Authenticated By: Janeece Riggers, M.D.    Dg Spine Portable 1 View  10/02/2012  *RADIOLOGY REPORT*  Clinical Data: Status post fall.  Pain.  PORTABLE SPINE - 1 VIEW  Comparison: None.  Findings: The patient has a superior endplate compression fracture of L1 with vertebral body height loss estimated at 20%.  No bony retropulsion is identified.  Vertebral body height  is otherwise maintained.  IMPRESSION: Mild, acute appearing superior endplate compression fracture L1.   Original Report Authenticated By: Holley Dexter, M.D.    Dg Spine Portable 1 View  10/02/2012  *RADIOLOGY REPORT*  Clinical Data: Status post fall.  Pain.  PORTABLE SPINE - 1 VIEW  Comparison: None.  Findings: No fracture or subluxation is identified on single lateral view.  Prevertebral soft tissues appear normal.  The patient is in a hard collar.  IMPRESSION: No acute finding.   Original Report Authenticated By: Holley Dexter, M.D.    Dg C-arm 1-60 Min  10/02/2012  *RADIOLOGY REPORT*  Clinical Data: 22 year old male undergoing external fixation of ankles.  DG C-ARM 1-60  MIN, LEFT ANKLE - 2 VIEW, RIGHT ANKLE - 2 VIEW  Technique: Two intraoperative fluoroscopic views of the ankles.  Fluoroscopy time of 0-minutes-18-seconds was utilized.  Comparison:  Ankle CTs 10/02/2012.  Findings: Severely comminuted bilateral ankle fractures.  Partial visualization of external fixator hardware, better visualized over the left calcaneus.  IMPRESSION: Partial visualization of external fixator hardware about the ankles.   Original Report Authenticated By: Erskine Speed, M.D.     Anti-infectives: Anti-infectives   None      Assessment/Plan: s/p Procedure(s): Application of bilateral external fixators Across Ankles Fall B ankle FX - S/P B ex fix by Dr. Ophelia Charter, Dr Carola Frost to eval Verticle sheer pelvic FX - NWB L1 comp FX and superior endplate FXs L1-3 - per Dr. Franky Macho FEN - tolerating diet VTE - Lovenox starting today To 5N  LOS: 2 days    Violeta Gelinas, MD, MPH, FACS Pager: [phone]  10/04/2012

## 2012-10-04 NOTE — Progress Notes (Signed)
Patient ID: George Harrison, male   DOB: Jun 09, 1991, 22 y.o.   MRN: [id number] Right and left foot sensation intact. Surgery will be on Monday afternoon, ORIF of both distal tibia fractures. Discussed with pt plan, risks etc. He requests we proceed.

## 2012-10-05 LAB — CBC
HCT: 32.1 % — ABNORMAL LOW (ref 39.0–52.0)
Hemoglobin: 11.3 g/dL — ABNORMAL LOW (ref 13.0–17.0)
RBC: 3.81 MIL/uL — ABNORMAL LOW (ref 4.22–5.81)
WBC: 9.4 10*3/uL (ref 4.0–10.5)

## 2012-10-05 NOTE — Progress Notes (Signed)
Patient ID: George Harrison, male   DOB: [birth year], 22 y.o.   MRN: [id number] PATIENT ID: George Harrison        MRN:  [id number]          DOB/AGE: 17-Jun-1990 / 22 y.o.    [name], MD   Jacqualine Code, PA-C [address] [place], [place]  [address]                             [phone]   PROGRESS NOTE  Subjective:  negative for Chest Pain  negative for Shortness of Breath  negative for Nausea/Vomiting   negative for Calf Pain    Tolerating Diet: yes         Patient reports pain as mild.     Comfortable at rest  Objective: Vital signs in last 24 hours:   Patient Vitals for the past 24 hrs:  BP Temp Pulse Resp SpO2  10/05/12 0535 154/80 mmHg 98.7 F (37.1 C) 91 17 100 %  10/04/12 2102 156/83 mmHg 99 F (37.2 C) 79 16 100 %  10/04/12 1237 141/86 mmHg 98.8 F (37.1 C) 65 16 100 %  10/04/12 1200 165/97 mmHg - 80 16 99 %  10/04/12 1100 153/83 mmHg - 75 15 99 %      Intake/Output from previous day:   04/25 0701 - 04/26 0700 In: 780 [P.O.:480; I.V.:300] Out: 2375 [Urine:2375]   Intake/Output this shift:   04/26 0701 - 04/26 1900 In: 120 [P.O.:120] Out: -    Intake/Output     04/25 0701 - 04/26 0700 04/26 0701 - 04/27 0700   P.O. 480 120   I.V. 300    Total Intake 780 120   Urine 2375    Total Output 2375     Net -1595 +120           LABORATORY DATA:  Recent Labs  10/02/12 1442 10/02/12 1457 10/03/12 0555 10/03/12 1608 10/04/12 0420 10/05/12 0710  WBC 15.2*  --  11.0*  --  11.3* 9.4  HGB 14.8 16.0 12.8* 12.4* 11.9* 11.3*  HCT 41.0 47.0 35.4* 34.8* 33.0* 32.1*  PLT 176  --  122*  --  99* 105*    Recent Labs  10/02/12 1442 10/02/12 1457 10/03/12 0555 10/04/12 0420  NA 137 142 133* 132*  K 3.8 3.6 3.9 3.5  CL 99 105 98 98  CO2 21  --  30 28  BUN 11 11 6  3*  CREATININE 0.75 0.70 0.72 0.60  GLUCOSE 130* 137* 138* 142*  CALCIUM 9.6  --  8.4 8.6   Lab Results  Component Value Date   INR 1.20 10/02/2012    Recent Radiographic Studies :   Dg Elbow 2 Views Right  10/02/2012  *RADIOLOGY REPORT*  Clinical Data: 22 year old male status post trauma.  20 feet fall. Pain.  RIGHT ELBOW - 2 VIEW  Comparison: None.  Findings: Cross-table lateral view.  No elbow joint effusion identified. Bone mineralization is within normal limits.  On the AP view, the elbow is not extended.  Joint spaces and alignment within normal limits.  No acute fracture identified.  IMPRESSION: No acute fracture or dislocation identified about the right elbow.   Original Report Authenticated By: Erskine Speed, M.D.    Dg Ankle 2 Views Left  10/02/2012  *RADIOLOGY REPORT*  Clinical Data: 22 year old male undergoing external fixation of ankles.  DG C-ARM 1-60 MIN, LEFT ANKLE -  2 VIEW, RIGHT ANKLE - 2 VIEW  Technique: Two intraoperative fluoroscopic views of the ankles.  Fluoroscopy time of 0-minutes-18-seconds was utilized.  Comparison:  Ankle CTs 10/02/2012.  Findings: Severely comminuted bilateral ankle fractures.  Partial visualization of external fixator hardware, better visualized over the left calcaneus.  IMPRESSION: Partial visualization of external fixator hardware about the ankles.   Original Report Authenticated By: Erskine Speed, M.D.    Dg Ankle 2 Views Left  10/02/2012  *RADIOLOGY REPORT*  Clinical Data: Fall.  Pain.  The patient jumped from 20 feet.  LEFT ANKLE - 2 VIEW  Comparison: None.  Findings: A comminuted fracture dislocation is present to.  The distal tibia is fractured.  The talus displaced anteriorly. The fibula is displaced posteriorly.  There may be a talar fracture.  IMPRESSION:  1. Fracture dislocation of the ankle. 2.  Comminuted anterior medial tibial fracture within the displacement the talus. 3.  Dislocation of fibula without definite fracture. 4.  Question posterior talar dome fracture.  Recommend CT of the ankle for further evaluation.   Original Report Authenticated By: Marin Roberts, M.D.    Dg Ankle 2 Views Right  10/02/2012  *RADIOLOGY  REPORT*  Clinical Data: 22 year old male undergoing external fixation of ankles.  DG C-ARM 1-60 MIN, LEFT ANKLE - 2 VIEW, RIGHT ANKLE - 2 VIEW  Technique: Two intraoperative fluoroscopic views of the ankles.  Fluoroscopy time of 0-minutes-18-seconds was utilized.  Comparison:  Ankle CTs 10/02/2012.  Findings: Severely comminuted bilateral ankle fractures.  Partial visualization of external fixator hardware, better visualized over the left calcaneus.  IMPRESSION: Partial visualization of external fixator hardware about the ankles.   Original Report Authenticated By: Erskine Speed, M.D.    Dg Ankle 2 Views Right  10/02/2012  *RADIOLOGY REPORT*  Clinical Data: Trauma and pain.  RIGHT ANKLE - 2 VIEW  Comparison: None.  Findings: Artifact degradation on all views.  Moderate soft tissue swelling.  Suboptimal patient positioning. Comminuted distal tibial fracture with extension into the intra-articular surface.  No convincing evidence of fibular fracture; this is not well evaluated.  There is widening of the lateral ankle mortise.  IMPRESSION: Comminuted distal tibial fracture, intra-articular.  Widening of the lateral ankle mortise.  Suboptimal exam secondary patient positioning and overlying artifact.  Recommend further characterization with CT.   Original Report Authenticated By: Jeronimo Greaves, M.D.    Ct Chest Wo Contrast  10/02/2012  *RADIOLOGY REPORT*  Clinical Data: The patient jumped 20 feet from a bridge.  Fall.  CT CHEST WITHOUT CONTRAST  Technique:  Multidetector CT imaging of the chest was performed following the standard protocol without IV contrast.  Comparison: Two-view chest x-ray 08/29/2011.  Findings: The heart size is normal.  The mediastinum is unremarkable.  No significant adenopathy is present.  The lungs are clear.  Rightward curvature of the thoracic spine is stable, the apex is at T9-10.  No acute fracture or subluxation is present in the thoracic spine.  The ribs are intact.  The superior  endplate fracture of L1 is again seen with minimal retropulsion of bone.  IMPRESSION:  1.  Stable rightward curvature of the thoracic spine. 2.  No acute trauma to the chest. 3.  Superior endplate compression fracture of L1 with minimal retropulsion of bone.   Original Report Authenticated By: Marin Roberts, M.D.    Ct Cervical Spine Wo Contrast  10/02/2012  *RADIOLOGY REPORT*  Clinical Data: Fall.  Jumped from bridge.  20 foot drop.  CT CERVICAL  SPINE WITHOUT CONTRAST  Technique:  Multidetector CT imaging of the cervical spine was performed. Multiplanar CT image reconstructions were also generated.  Comparison: None.  Findings: The cervical spine is imaged from the skull base through T2-3.  The vertebral body heights and alignment are maintained.  No acute fracture or traumatic subluxation is evident.  The lung apices are clear.  The soft tissues are unremarkable.  IMPRESSION: Negative CT of the cervical spine.   Original Report Authenticated By: Marin Roberts, M.D.    Ct Abdomen Pelvis W Contrast  10/02/2012  *RADIOLOGY REPORT*  Clinical Data: 20 feet fall after jumping off of the bridge to ovoid a train.  CT ABDOMEN AND PELVIS WITH CONTRAST  Technique:  Multidetector CT imaging of the abdomen and pelvis was performed following the standard protocol during bolus administration of intravenous contrast.  Contrast: OMNIPAQUE IOHEXOL 300 MG/ML  SOLN  Comparison: Radiographs from 10/02/2012  Findings: Liver, spleen, pancreas, and adrenal glands appear normal.  1.3 cm left kidney lower pole simple cyst; kidneys otherwise unremarkable.  Trace pericardial fluid noted inferiorly, without a definite pericardial effusion.  Anterior compression fracture node along the superior endplate at L1 with 2 mm posterior bony retropulsion.  Minimal superior endplate compression fractures noted at L2 and L3 best appreciated on the parasagittal images.  Levoconvex lumbar scoliosis may be positional in nature.   There is spur-like irregularity along the margin of the left femoral head and neck, and to a lesser extent along the margin of the right femoral head and neck.  A vertically oriented parasagittal right sacral fracture extends through the sacral foramina on the right.  Mildly comminuted inferior pubic ramus fracture on the right with a subtle nondisplaced superior pubic ramus fracture extending into the anterior column of the right acetabulum.  Suspected small avulsion fractures along the margins of the pubic symphysis.  No pubic symphysis widening noted.  There is a moderate amount of anterior extraperitoneal hematoma along the upper and anterior margin of the urinary bladder and extending into the space of Retzius, particularly on the right side in the vicinity of the right sided pelvic fractures.  Trace amount of free pelvic fluid.  Abnormal presacral hematoma/edema noted. There is likely minimal expansion of the right obturator internus and right piriformis muscles due to hematoma.  We do not demonstrate active extravasation of contrast medium to suggest active bleeding at the time of the scan.  No free intraperitoneal gas.  IMPRESSION:  1.  Right pelvic fractures including a vertical fracture through the right sacrum, mildly comminuted right inferior pubic ramus fracture, small a pulsion fractures of the pubic symphysis margins without pubic symphysis widening, and a nondisplaced fracture of the right superior pubic ramus extending into the anterior column of the right acetabulum. 2.  Spurring along the margins of the femoral head and neck bilaterally, left greater than right.  This could well be degenerative and is not necessarily due to fracture; MRI could help differentiate if clinically warranted. 3.  Superior endplate compressions at L1, L2, and L3, with minimal posterior bony retropulsion at the L1 compression fracture. 4.  Trace pericardial fluid noted inferiorly.   Original Report Authenticated By:  Gaylyn Rong, M.D.    Ct Ankle Right Wo Contrast  10/02/2012  *RADIOLOGY REPORT*  Clinical Data: Ankle fractures after jumping from a bridge.  CT OF THE RIGHT ANKLE WITH CONTRAST  Technique:  Multidetector CT imaging was performed following the standard protocol during bolus administration of intravenous contrast.  Comparison: Radiographs dated 10/02/2012  Findings: There is a comminuted fracture of the distal tibia with slight posterior subluxation of the major fragment with respect of the dome of the talus.  There is a spiral component of the fracture which extends 12 cm proximal to the ankle joint.  There are two tiny bone fragments lying anterior to the dorsal aspect of the distal talus.  Talus and calcaneus are intact as is the distal fibula.  On the axial images the posterior tibial tendon and flexor digitorum longus tendon are slightly subluxed into the posterior aspect of the fracture of the articular surface of the distal tibia.  Peroneal tendons, Achilles tendon, anterior tibial tendon appear normal.  IMPRESSION: Comminuted impacted and slightly displaced fractures of the distal tibia.   Original Report Authenticated By: Francene Boyers, M.D.    Dg Pelvis Portable  10/02/2012  *RADIOLOGY REPORT*  Clinical Data: Status post fall.  Pain.  PORTABLE PELVIS  Comparison: None.  Findings: The patient has a mildly impacted subcapital left hip fracture.  No other acute bony or joint abnormality is identified.  IMPRESSION: Mildly impacted subcapital left hip fracture.   Original Report Authenticated By: Holley Dexter, M.D.    Ct Ankle Left Wo Contrast  10/02/2012  *RADIOLOGY REPORT*  Clinical Data: Bilateral ankle fractures after jumping from a bridge.  CT OF THE LEFT ANKLE WITHOUT CONTRAST  Technique:  Multidetector CT imaging was performed according to the standard protocol. Multiplanar CT image reconstructions were also generated.  Comparison: Radiographs dated 10/02/2012  Findings: There is a  severely comminuted fracture of the distal tibia with numerous tiny fragments of the articular surface. Medial malleolus is avulsed and comminuted and proximally displaced.  The posterior aspect of the talar dome is disintegrated and the posterior major fragment of the distal tibia is impacted into this area.  There is a comminuted fracture of the anterior aspect of the tibia overlying the dome of the talus.  There is also a fracture of the medial aspect of the talus with slight displacement.  There is a comminuted avulsion fracture of the posterior-superior lateral aspect of the calcaneus as well as a small avulsion fracture of the posterior medial aspect of the plantar aspect of the calcaneus.  There are numerous tiny bone fragments around the posterior tibialis and flexor digitorum longus tendons at the level of the medial malleolus.  There is a fragment of the medial malleolus which appears impale the posterior tibialis tendon on image number 47 of series 3.  Achilles tendon appears intact.  Peroneal tendons and anterior tibial tendon appear intact.  The tarsal bones distal to the talus and calcaneus are intact.  IMPRESSION: Severely comminuted fracture of the left ankle joint including fractures of the talus and calcaneus.  No fractures of the medial malleolus.  Impingement upon the posterior tibialis and flexor digitorum longus tendon by bone fragments.   Original Report Authenticated By: Francene Boyers, M.D.    Dg Chest Port 1 View  10/03/2012  *RADIOLOGY REPORT*  Clinical Data: Follow-up spine fracture  PORTABLE CHEST - 1 VIEW  Comparison: Yesterday  Findings: Normal heart size.  Clear lungs.  No pneumothorax.  IMPRESSION: Negative.   Original Report Authenticated By: Jolaine Click, M.D.    Dg Chest Portable 1 View  10/02/2012  *RADIOLOGY REPORT*  Clinical Data: Fall 23 feet.  Trauma  PORTABLE CHEST - 1 VIEW  Comparison: 08/29/2011  Findings: Cardiac and mediastinal contours are normal.  Lungs are clear  without infiltrate or  effusion.  No pneumothorax or rib fracture.  IMPRESSION: Negative   Original Report Authenticated By: Janeece Riggers, M.D.    Dg Spine Portable 1 View  10/02/2012  *RADIOLOGY REPORT*  Clinical Data: Status post fall.  Pain.  PORTABLE SPINE - 1 VIEW  Comparison: None.  Findings: The patient has a superior endplate compression fracture of L1 with vertebral body height loss estimated at 20%.  No bony retropulsion is identified.  Vertebral body height is otherwise maintained.  IMPRESSION: Mild, acute appearing superior endplate compression fracture L1.   Original Report Authenticated By: Holley Dexter, M.D.    Dg Spine Portable 1 View  10/02/2012  *RADIOLOGY REPORT*  Clinical Data: Status post fall.  Pain.  PORTABLE SPINE - 1 VIEW  Comparison: None.  Findings: No fracture or subluxation is identified on single lateral view.  Prevertebral soft tissues appear normal.  The patient is in a hard collar.  IMPRESSION: No acute finding.   Original Report Authenticated By: Holley Dexter, M.D.    Dg C-arm 1-60 Min  10/02/2012  *RADIOLOGY REPORT*  Clinical Data: 22 year old male undergoing external fixation of ankles.  DG C-ARM 1-60 MIN, LEFT ANKLE - 2 VIEW, RIGHT ANKLE - 2 VIEW  Technique: Two intraoperative fluoroscopic views of the ankles.  Fluoroscopy time of 0-minutes-18-seconds was utilized.  Comparison:  Ankle CTs 10/02/2012.  Findings: Severely comminuted bilateral ankle fractures.  Partial visualization of external fixator hardware, better visualized over the left calcaneus.  IMPRESSION: Partial visualization of external fixator hardware about the ankles.   Original Report Authenticated By: Erskine Speed, M.D.      Examination:  General appearance: alert, cooperative and no distress  Wound Exam: clean, dry, intact   Drainage:  Scant/small amount Serosanguinous exudate  Motor Exam: EHL, FHL, Anterior Tibial and Posterior Tibial Intact  Sensory Exam: Superficial Peroneal, Deep  Peroneal and Tibial normal  Vascular Exam: Normal  Assessment:    3 Days Post-Op  Procedure(s) (LRB): Application of bilateral external fixators Across Ankles (Bilateral)  ADDITIONAL DIAGNOSIS:  Active Problems:   Fall   L1 vertebral fracture   L2 vertebral fracture   L3 vertebral fracture   Sacral fracture   Fracture of multiple pubic rami   Closed pilon fracture of left tibia   Closed pilon fracture of right tibia   Fracture of talus of right ankle, closed  Acute Blood Loss Anemia-stable   Plan:     DISCHARGE NEEDS: Walker   stable-OR MON for ORIF ankle fractures      [name] W 10/05/2012 9:59 AM

## 2012-10-05 NOTE — Progress Notes (Signed)
Patient ID: AJAY STRUBEL, male   DOB: [birth year], 22 y.o.   MRN: [id number] 3 Days Post-Op  Subjective: Pt feels well.  Says pain is a "0"  Eating well.    Objective: Vital signs in last 24 hours: Temp:  [98.7 F (37.1 C)-99 F (37.2 C)] 98.7 F (37.1 C) (04/26 0535) Pulse Rate:  [65-91] 91 (04/26 0535) Resp:  [15-17] 17 (04/26 0535) BP: (141-165)/(80-97) 154/80 mmHg (04/26 0535) SpO2:  [99 %-100 %] 100 % (04/26 0535) Last BM Date: 09/30/12  Intake/Output from previous day: 04/25 0701 - 04/26 0700 In: 780 [P.O.:480; I.V.:300] Out: 2375 [Urine:2375] Intake/Output this shift: Total I/O In: 120 [P.O.:120] Out: -   PE: Heart: regular, rate, and rhythm Lungs: CTAB, IS 2500+ Abd: soft, Nt, ND Ext: +2 BDP pulses, NVI, Ex Fix in place bilaterally, still with some bilateral foot and ankle swelling.  Lab Results:   Recent Labs  10/04/12 0420 10/05/12 0710  WBC 11.3* 9.4  HGB 11.9* 11.3*  HCT 33.0* 32.1*  PLT 99* 105*   BMET  Recent Labs  10/03/12 0555 10/04/12 0420  NA 133* 132*  K 3.9 3.5  CL 98 98  CO2 30 28  GLUCOSE 138* 142*  BUN 6 3*  CREATININE 0.72 0.60  CALCIUM 8.4 8.6   PT/INR  Recent Labs  10/02/12 1442  LABPROT 15.0  INR 1.20   CMP     Component Value Date/Time   NA 132* 10/04/2012 0420   K 3.5 10/04/2012 0420   CL 98 10/04/2012 0420   CO2 28 10/04/2012 0420   GLUCOSE 142* 10/04/2012 0420   BUN 3* 10/04/2012 0420   CREATININE 0.60 10/04/2012 0420   CALCIUM 8.6 10/04/2012 0420   PROT 7.4 10/02/2012 1442   ALBUMIN 4.2 10/02/2012 1442   AST 70* 10/02/2012 1442   ALT 40 10/02/2012 1442   ALKPHOS 56 10/02/2012 1442   BILITOT 0.5 10/02/2012 1442   GFRNONAA >90 10/04/2012 0420   GFRAA >90 10/04/2012 0420   Lipase     Component Value Date/Time   LIPASE 21 07/14/2012 1534   Studies/Results: No results found.  Anti-infectives: Anti-infectives   None      Assessment/Plan 1.  s/p Procedure(s):   Application of bilateral external fixators Across  Ankles  Fall (Jump from train tressel) B ankle FX - S/P B ex fix by Dr. Ophelia Charter, Dr Carola Frost to eval.  OR on Monday for repair  Verticle sheer pelvic FX - NWB  L1 comp FX and superior endplate FXs L1-3 - per Dr. Franky Macho  FEN - tolerating diet  VTE - Lovenox started 4/25   LOS: 3 days    OSBORNE,KELLY E 10/05/2012, 9:51 AM Pager: [phone]  Agree with above.  Hurting a little more now that earlier.  Ovidio Kin, MD, [facility] Surgery Pager: [phone] Office phone:  [phone]

## 2012-10-06 NOTE — Progress Notes (Signed)
CSW spoke with pt re: need for SA treatment/resources.  Pt denies drug problem and has not smoked any marijuana in 9 days.  Pt not interested in SA resources at this time.

## 2012-10-06 NOTE — Progress Notes (Addendum)
Patient ID: George Harrison, male   DOB: 1990-09-03, 22 y.o.   MRN: [id number] 4 Days Post-Op  Subjective: Pt without c/o.  Eating well.  Pain controlled  Objective: Vital signs in last 24 hours: Temp:  [98.5 F (36.9 C)-99.8 F (37.7 C)] 98.5 F (36.9 C) (04/27 0622) Pulse Rate:  [64-89] 64 (04/27 0622) Resp:  [16-18] 16 (04/27 0622) BP: (124-152)/(82-97) 152/93 mmHg (04/27 0622) SpO2:  [100 %] 100 % (04/27 0622) Last BM Date: 09/30/12  Intake/Output from previous day: 04/26 0701 - 04/27 0700 In: 240 [P.O.:240] Out: 1400 [Urine:1400] Intake/Output this shift: Total I/O In: 360 [P.O.:360] Out: -   PE: General: NAD Heart: regular Lungs: CTAB Ext: some decrease edema in BLE.  Ex fixes in place.  NVI  Lab Results:   Recent Labs  10/04/12 0420 10/05/12 0710  WBC 11.3* 9.4  HGB 11.9* 11.3*  HCT 33.0* 32.1*  PLT 99* 105*   BMET  Recent Labs  10/04/12 0420  NA 132*  K 3.5  CL 98  CO2 28  GLUCOSE 142*  BUN 3*  CREATININE 0.60  CALCIUM 8.6   PT/INR No results found for this basename: LABPROT, INR,  in the last 72 hours CMP     Component Value Date/Time   NA 132* 10/04/2012 0420   K 3.5 10/04/2012 0420   CL 98 10/04/2012 0420   CO2 28 10/04/2012 0420   GLUCOSE 142* 10/04/2012 0420   BUN 3* 10/04/2012 0420   CREATININE 0.60 10/04/2012 0420   CALCIUM 8.6 10/04/2012 0420   PROT 7.4 10/02/2012 1442   ALBUMIN 4.2 10/02/2012 1442   AST 70* 10/02/2012 1442   ALT 40 10/02/2012 1442   ALKPHOS 56 10/02/2012 1442   BILITOT 0.5 10/02/2012 1442   GFRNONAA >90 10/04/2012 0420   GFRAA >90 10/04/2012 0420   Lipase     Component Value Date/Time   LIPASE 21 07/14/2012 1534    Studies/Results: No results found.  Anti-infectives: Anti-infectives   None     Assessment/Plan   1. s/p Procedure(s):  Application of bilateral external fixators Across Ankles  Fall (Jump from train tressel)  B ankle FX - S/P B ex fix by Dr. Ophelia Charter, Dr Carola Frost to eval. OR on Monday for repair   Verticle sheer pelvic FX - NWB  L1 comp FX and superior endplate FXs L1-3 - per Dr. Franky Macho  FEN - tolerating diet  VTE - Lovenox started 4/25   LOS: 4 days   OSBORNE,KELLY E 10/06/2012, 9:10 AM Pager: [phone]  Agree with above. Feels better than yesterday.  Desma Paganini, girlfriend, at bedside.  She said that she would be taking care of him at home.  Ovidio Kin, MD, [facility] Surgery Pager: [phone] Office phone:  [phone]

## 2012-10-06 NOTE — Progress Notes (Signed)
Patient ID: George Harrison, male   DOB: [birth year], 22 y.o.   MRN: [id number] PATIENT ID: George Harrison        MRN:  [id number]          DOB/AGE: Jul 15, 1990 / 22 y.o.    George Campbell, MD   George Code, PA-C [address] [place], [place]  [address]                             [phone]   PROGRESS NOTE  Subjective:  negative for Chest Pain  negative for Shortness of Breath  negative for Nausea/Vomiting   negative for Calf Pain    Tolerating Diet: yes         Patient reports pain as mild and moderate.     MORE COMFORTABLE TODAY  Objective: Vital signs in last 24 hours:   Patient Vitals for the past 24 hrs:  BP Temp Temp src Pulse Resp SpO2  10/06/12 0622 152/93 mmHg 98.5 F (36.9 C) Oral 64 16 100 %  10/05/12 2010 146/91 mmHg 99.8 F (37.7 C) Oral 77 16 100 %  10/05/12 1700 144/82 mmHg - - 89 16 100 %  10/05/12 1422 124/97 mmHg 98.8 F (37.1 C) - 79 18 100 %      Intake/Output from previous day:   04/26 0701 - 04/27 0700 In: 240 [P.O.:240] Out: 1400 [Urine:1400]   Intake/Output this shift:   04/27 0701 - 04/27 1900 In: 360 [P.O.:360] Out: -    Intake/Output     04/26 0701 - 04/27 0700 04/27 0701 - 04/28 0700   P.O. 240 360   I.V.     Total Intake 240 360   Urine 1400    Total Output 1400     Net -1160 +360           LABORATORY DATA:  Recent Labs  10/02/12 1442 10/02/12 1457 10/03/12 0555 10/03/12 1608 10/04/12 0420 10/05/12 0710  WBC 15.2*  --  11.0*  --  11.3* 9.4  HGB 14.8 16.0 12.8* 12.4* 11.9* 11.3*  HCT 41.0 47.0 35.4* 34.8* 33.0* 32.1*  PLT 176  --  122*  --  99* 105*    Recent Labs  10/02/12 1442 10/02/12 1457 10/03/12 0555 10/04/12 0420  NA 137 142 133* 132*  K 3.8 3.6 3.9 3.5  CL 99 105 98 98  CO2 21  --  30 28  BUN 11 11 6  3*  CREATININE 0.75 0.70 0.72 0.60  GLUCOSE 130* 137* 138* 142*  CALCIUM 9.6  --  8.4 8.6   Lab Results  Component Value Date   INR 1.20 10/02/2012    Recent Radiographic Studies :  Dg Elbow 2  Views Right  10/02/2012  *RADIOLOGY REPORT*  Clinical Data: 22 year old male status post trauma.  20 feet fall. Pain.  RIGHT ELBOW - 2 VIEW  Comparison: None.  Findings: Cross-table lateral view.  No elbow joint effusion identified. Bone mineralization is within normal limits.  On the AP view, the elbow is not extended.  Joint spaces and alignment within normal limits.  No acute fracture identified.  IMPRESSION: No acute fracture or dislocation identified about the right elbow.   Original Report Authenticated By: Erskine Speed, M.D.    Dg Ankle 2 Views Left  10/02/2012  *RADIOLOGY REPORT*  Clinical Data: 22 year old male undergoing external fixation of ankles.  DG C-ARM 1-60 MIN, LEFT ANKLE - 2 VIEW, RIGHT  ANKLE - 2 VIEW  Technique: Two intraoperative fluoroscopic views of the ankles.  Fluoroscopy time of 0-minutes-18-seconds was utilized.  Comparison:  Ankle CTs 10/02/2012.  Findings: Severely comminuted bilateral ankle fractures.  Partial visualization of external fixator hardware, better visualized over the left calcaneus.  IMPRESSION: Partial visualization of external fixator hardware about the ankles.   Original Report Authenticated By: Erskine Speed, M.D.    Dg Ankle 2 Views Left  10/02/2012  *RADIOLOGY REPORT*  Clinical Data: Fall.  Pain.  The patient jumped from 20 feet.  LEFT ANKLE - 2 VIEW  Comparison: None.  Findings: A comminuted fracture dislocation is present to.  The distal tibia is fractured.  The talus displaced anteriorly. The fibula is displaced posteriorly.  There may be a talar fracture.  IMPRESSION:  1. Fracture dislocation of the ankle. 2.  Comminuted anterior medial tibial fracture within the displacement the talus. 3.  Dislocation of fibula without definite fracture. 4.  Question posterior talar dome fracture.  Recommend CT of the ankle for further evaluation.   Original Report Authenticated By: Marin Roberts, M.D.    Dg Ankle 2 Views Right  10/02/2012  *RADIOLOGY REPORT*   Clinical Data: 22 year old male undergoing external fixation of ankles.  DG C-ARM 1-60 MIN, LEFT ANKLE - 2 VIEW, RIGHT ANKLE - 2 VIEW  Technique: Two intraoperative fluoroscopic views of the ankles.  Fluoroscopy time of 0-minutes-18-seconds was utilized.  Comparison:  Ankle CTs 10/02/2012.  Findings: Severely comminuted bilateral ankle fractures.  Partial visualization of external fixator hardware, better visualized over the left calcaneus.  IMPRESSION: Partial visualization of external fixator hardware about the ankles.   Original Report Authenticated By: Erskine Speed, M.D.    Dg Ankle 2 Views Right  10/02/2012  *RADIOLOGY REPORT*  Clinical Data: Trauma and pain.  RIGHT ANKLE - 2 VIEW  Comparison: None.  Findings: Artifact degradation on all views.  Moderate soft tissue swelling.  Suboptimal patient positioning. Comminuted distal tibial fracture with extension into the intra-articular surface.  No convincing evidence of fibular fracture; this is not well evaluated.  There is widening of the lateral ankle mortise.  IMPRESSION: Comminuted distal tibial fracture, intra-articular.  Widening of the lateral ankle mortise.  Suboptimal exam secondary patient positioning and overlying artifact.  Recommend further characterization with CT.   Original Report Authenticated By: Jeronimo Greaves, M.D.    Ct Chest Wo Contrast  10/02/2012  *RADIOLOGY REPORT*  Clinical Data: The patient jumped 20 feet from a bridge.  Fall.  CT CHEST WITHOUT CONTRAST  Technique:  Multidetector CT imaging of the chest was performed following the standard protocol without IV contrast.  Comparison: Two-view chest x-ray 08/29/2011.  Findings: The heart size is normal.  The mediastinum is unremarkable.  No significant adenopathy is present.  The lungs are clear.  Rightward curvature of the thoracic spine is stable, the apex is at T9-10.  No acute fracture or subluxation is present in the thoracic spine.  The ribs are intact.  The superior endplate  fracture of L1 is again seen with minimal retropulsion of bone.  IMPRESSION:  1.  Stable rightward curvature of the thoracic spine. 2.  No acute trauma to the chest. 3.  Superior endplate compression fracture of L1 with minimal retropulsion of bone.   Original Report Authenticated By: Marin Roberts, M.D.    Ct Cervical Spine Wo Contrast  10/02/2012  *RADIOLOGY REPORT*  Clinical Data: Fall.  Jumped from bridge.  20 foot drop.  CT CERVICAL SPINE WITHOUT CONTRAST  Technique:  Multidetector CT imaging of the cervical spine was performed. Multiplanar CT image reconstructions were also generated.  Comparison: None.  Findings: The cervical spine is imaged from the skull base through T2-3.  The vertebral body heights and alignment are maintained.  No acute fracture or traumatic subluxation is evident.  The lung apices are clear.  The soft tissues are unremarkable.  IMPRESSION: Negative CT of the cervical spine.   Original Report Authenticated By: Marin Roberts, M.D.    Ct Abdomen Pelvis W Contrast  10/02/2012  *RADIOLOGY REPORT*  Clinical Data: 20 feet fall after jumping off of the bridge to ovoid a train.  CT ABDOMEN AND PELVIS WITH CONTRAST  Technique:  Multidetector CT imaging of the abdomen and pelvis was performed following the standard protocol during bolus administration of intravenous contrast.  Contrast: OMNIPAQUE IOHEXOL 300 MG/ML  SOLN  Comparison: Radiographs from 10/02/2012  Findings: Liver, spleen, pancreas, and adrenal glands appear normal.  1.3 cm left kidney lower pole simple cyst; kidneys otherwise unremarkable.  Trace pericardial fluid noted inferiorly, without a definite pericardial effusion.  Anterior compression fracture node along the superior endplate at L1 with 2 mm posterior bony retropulsion.  Minimal superior endplate compression fractures noted at L2 and L3 best appreciated on the parasagittal images.  Levoconvex lumbar scoliosis may be positional in nature.  There is  spur-like irregularity along the margin of the left femoral head and neck, and to a lesser extent along the margin of the right femoral head and neck.  A vertically oriented parasagittal right sacral fracture extends through the sacral foramina on the right.  Mildly comminuted inferior pubic ramus fracture on the right with a subtle nondisplaced superior pubic ramus fracture extending into the anterior column of the right acetabulum.  Suspected small avulsion fractures along the margins of the pubic symphysis.  No pubic symphysis widening noted.  There is a moderate amount of anterior extraperitoneal hematoma along the upper and anterior margin of the urinary bladder and extending into the space of Retzius, particularly on the right side in the vicinity of the right sided pelvic fractures.  Trace amount of free pelvic fluid.  Abnormal presacral hematoma/edema noted. There is likely minimal expansion of the right obturator internus and right piriformis muscles due to hematoma.  We do not demonstrate active extravasation of contrast medium to suggest active bleeding at the time of the scan.  No free intraperitoneal gas.  IMPRESSION:  1.  Right pelvic fractures including a vertical fracture through the right sacrum, mildly comminuted right inferior pubic ramus fracture, small a pulsion fractures of the pubic symphysis margins without pubic symphysis widening, and a nondisplaced fracture of the right superior pubic ramus extending into the anterior column of the right acetabulum. 2.  Spurring along the margins of the femoral head and neck bilaterally, left greater than right.  This could well be degenerative and is not necessarily due to fracture; MRI could help differentiate if clinically warranted. 3.  Superior endplate compressions at L1, L2, and L3, with minimal posterior bony retropulsion at the L1 compression fracture. 4.  Trace pericardial fluid noted inferiorly.   Original Report Authenticated By: Gaylyn Rong, M.D.    Ct Ankle Right Wo Contrast  10/02/2012  *RADIOLOGY REPORT*  Clinical Data: Ankle fractures after jumping from a bridge.  CT OF THE RIGHT ANKLE WITH CONTRAST  Technique:  Multidetector CT imaging was performed following the standard protocol during bolus administration of intravenous contrast.  Comparison: Radiographs dated 10/02/2012  Findings: There is a comminuted fracture of the distal tibia with slight posterior subluxation of the major fragment with respect of the dome of the talus.  There is a spiral component of the fracture which extends 12 cm proximal to the ankle joint.  There are two tiny bone fragments lying anterior to the dorsal aspect of the distal talus.  Talus and calcaneus are intact as is the distal fibula.  On the axial images the posterior tibial tendon and flexor digitorum longus tendon are slightly subluxed into the posterior aspect of the fracture of the articular surface of the distal tibia.  Peroneal tendons, Achilles tendon, anterior tibial tendon appear normal.  IMPRESSION: Comminuted impacted and slightly displaced fractures of the distal tibia.   Original Report Authenticated By: Francene Boyers, M.D.    Dg Pelvis Portable  10/02/2012  *RADIOLOGY REPORT*  Clinical Data: Status post fall.  Pain.  PORTABLE PELVIS  Comparison: None.  Findings: The patient has a mildly impacted subcapital left hip fracture.  No other acute bony or joint abnormality is identified.  IMPRESSION: Mildly impacted subcapital left hip fracture.   Original Report Authenticated By: Holley Dexter, M.D.    Ct Ankle Left Wo Contrast  10/02/2012  *RADIOLOGY REPORT*  Clinical Data: Bilateral ankle fractures after jumping from a bridge.  CT OF THE LEFT ANKLE WITHOUT CONTRAST  Technique:  Multidetector CT imaging was performed according to the standard protocol. Multiplanar CT image reconstructions were also generated.  Comparison: Radiographs dated 10/02/2012  Findings: There is a severely  comminuted fracture of the distal tibia with numerous tiny fragments of the articular surface. Medial malleolus is avulsed and comminuted and proximally displaced.  The posterior aspect of the talar dome is disintegrated and the posterior major fragment of the distal tibia is impacted into this area.  There is a comminuted fracture of the anterior aspect of the tibia overlying the dome of the talus.  There is also a fracture of the medial aspect of the talus with slight displacement.  There is a comminuted avulsion fracture of the posterior-superior lateral aspect of the calcaneus as well as a small avulsion fracture of the posterior medial aspect of the plantar aspect of the calcaneus.  There are numerous tiny bone fragments around the posterior tibialis and flexor digitorum longus tendons at the level of the medial malleolus.  There is a fragment of the medial malleolus which appears impale the posterior tibialis tendon on image number 47 of series 3.  Achilles tendon appears intact.  Peroneal tendons and anterior tibial tendon appear intact.  The tarsal bones distal to the talus and calcaneus are intact.  IMPRESSION: Severely comminuted fracture of the left ankle joint including fractures of the talus and calcaneus.  No fractures of the medial malleolus.  Impingement upon the posterior tibialis and flexor digitorum longus tendon by bone fragments.   Original Report Authenticated By: Francene Boyers, M.D.    Dg Chest Port 1 View  10/03/2012  *RADIOLOGY REPORT*  Clinical Data: Follow-up spine fracture  PORTABLE CHEST - 1 VIEW  Comparison: Yesterday  Findings: Normal heart size.  Clear lungs.  No pneumothorax.  IMPRESSION: Negative.   Original Report Authenticated By: Jolaine Click, M.D.    Dg Chest Portable 1 View  10/02/2012  *RADIOLOGY REPORT*  Clinical Data: Fall 23 feet.  Trauma  PORTABLE CHEST - 1 VIEW  Comparison: 08/29/2011  Findings: Cardiac and mediastinal contours are normal.  Lungs are clear without  infiltrate or effusion.  No pneumothorax or  rib fracture.  IMPRESSION: Negative   Original Report Authenticated By: Janeece Riggers, M.D.    Dg Spine Portable 1 View  10/02/2012  *RADIOLOGY REPORT*  Clinical Data: Status post fall.  Pain.  PORTABLE SPINE - 1 VIEW  Comparison: None.  Findings: The patient has a superior endplate compression fracture of L1 with vertebral body height loss estimated at 20%.  No bony retropulsion is identified.  Vertebral body height is otherwise maintained.  IMPRESSION: Mild, acute appearing superior endplate compression fracture L1.   Original Report Authenticated By: Holley Dexter, M.D.    Dg Spine Portable 1 View  10/02/2012  *RADIOLOGY REPORT*  Clinical Data: Status post fall.  Pain.  PORTABLE SPINE - 1 VIEW  Comparison: None.  Findings: No fracture or subluxation is identified on single lateral view.  Prevertebral soft tissues appear normal.  The patient is in a hard collar.  IMPRESSION: No acute finding.   Original Report Authenticated By: Holley Dexter, M.D.    Dg C-arm 1-60 Min  10/02/2012  *RADIOLOGY REPORT*  Clinical Data: 22 year old male undergoing external fixation of ankles.  DG C-ARM 1-60 MIN, LEFT ANKLE - 2 VIEW, RIGHT ANKLE - 2 VIEW  Technique: Two intraoperative fluoroscopic views of the ankles.  Fluoroscopy time of 0-minutes-18-seconds was utilized.  Comparison:  Ankle CTs 10/02/2012.  Findings: Severely comminuted bilateral ankle fractures.  Partial visualization of external fixator hardware, better visualized over the left calcaneus.  IMPRESSION: Partial visualization of external fixator hardware about the ankles.   Original Report Authenticated By: Erskine Speed, M.D.      Examination:  General appearance: alert, cooperative, mild distress and moderate distress  Wound Exam: clean, dry, intact exfix  Drainage:  None: wound tissue dry  Motor Exam: EHL and FHL Impaired and limited secondary to ex fix   Sensory Exam: Superficial Peroneal and  Deep Peroneal normal   Assessment:    4 Days Post-Op  Procedure(s) (LRB): Application of bilateral external fixators Across Ankles (Bilateral)  ADDITIONAL DIAGNOSIS:  Active Problems:   Fall   L1 vertebral fracture   L2 vertebral fracture   L3 vertebral fracture   Sacral fracture   Fracture of multiple pubic rami   Closed pilon fracture of left tibia   Closed pilon fracture of right tibia   Fracture of talus of right ankle, closed     Plan:  DVT Prophylaxis:  Lovenox  DISCHARGE PLAN: Home according to patient  Apparent plan for surgery tomorrow.         [facility] 10/06/2012 10:04 AM

## 2012-10-07 ENCOUNTER — Encounter (HOSPITAL_COMMUNITY): Payer: Self-pay | Admitting: Anesthesiology

## 2012-10-07 ENCOUNTER — Encounter (HOSPITAL_COMMUNITY): Admission: EM | Disposition: A | Discharge status: Home / Self Care | Payer: Self-pay | Source: Home / Self Care

## 2012-10-07 ENCOUNTER — Inpatient Hospital Stay (HOSPITAL_COMMUNITY): Payer: MEDICAID

## 2012-10-07 ENCOUNTER — Inpatient Hospital Stay (HOSPITAL_COMMUNITY): Payer: MEDICAID | Admitting: Anesthesiology

## 2012-10-07 HISTORY — PX: ORIF TIBIA FRACTURE: SHX5416

## 2012-10-07 HISTORY — PX: EXTERNAL FIXATION REMOVAL: SHX5040

## 2012-10-07 LAB — CBC
HCT: 27.2 % — ABNORMAL LOW (ref 39.0–52.0)
MCV: 83.4 fL (ref 78.0–100.0)
RBC: 3.26 MIL/uL — ABNORMAL LOW (ref 4.22–5.81)
RDW: 13.1 % (ref 11.5–15.5)
WBC: 11.4 10*3/uL — ABNORMAL HIGH (ref 4.0–10.5)

## 2012-10-07 LAB — CREATININE, SERUM
GFR calc Af Amer: >90 mL/min (ref >90)
GFR calc non Af Amer: >90 mL/min (ref >90)

## 2012-10-07 SURGERY — OPEN REDUCTION INTERNAL FIXATION (ORIF) TIBIA FRACTURE
Anesthesia: General | Site: Leg Lower | Laterality: Right | Wound class: Clean

## 2012-10-07 MED ORDER — MIDAZOLAM HCL 5 MG/5ML IJ SOLN
INTRAMUSCULAR | Status: DC | PRN
  Administered 2012-10-07: 1 mg via INTRAVENOUS

## 2012-10-07 MED ORDER — METHOCARBAMOL 500 MG PO TABS
500.0000 mg | ORAL_TABLET | Freq: Four times a day (QID) | ORAL | Status: DC | PRN
  Administered 2012-10-07 – 2012-10-10 (×6): 500 mg via ORAL
  Filled 2012-10-07 (×6): qty 1

## 2012-10-07 MED ORDER — HYDROMORPHONE 0.3 MG/ML IV SOLN
INTRAVENOUS | Status: AC
  Filled 2012-10-07: qty 25

## 2012-10-07 MED ORDER — KETOROLAC TROMETHAMINE 30 MG/ML IJ SOLN
INTRAMUSCULAR | Status: AC
  Administered 2012-10-07: 30 mg via INTRAVENOUS
  Filled 2012-10-07: qty 1

## 2012-10-07 MED ORDER — DOCUSATE SODIUM 100 MG PO CAPS
100.0000 mg | ORAL_CAPSULE | Freq: Two times a day (BID) | ORAL | Status: DC
  Administered 2012-10-07 – 2012-10-10 (×6): 100 mg via ORAL
  Filled 2012-10-07 (×9): qty 1

## 2012-10-07 MED ORDER — ONDANSETRON HCL 4 MG PO TABS: 4.0000 mg | ORAL_TABLET | Freq: Four times a day (QID) | ORAL | Status: DC | PRN

## 2012-10-07 MED ORDER — NALOXONE HCL 0.4 MG/ML IJ SOLN: 0.4000 mg | INTRAMUSCULAR | Status: DC | PRN

## 2012-10-07 MED ORDER — HYDROMORPHONE HCL PF 1 MG/ML IJ SOLN
0.2500 mg | INTRAMUSCULAR | Status: DC | PRN
  Administered 2012-10-07: 0.5 mg via INTRAVENOUS

## 2012-10-07 MED ORDER — SODIUM CHLORIDE 0.9 % IJ SOLN: 9.0000 mL | INTRAMUSCULAR | Status: DC | PRN

## 2012-10-07 MED ORDER — LACTATED RINGERS IV SOLN
INTRAVENOUS | Status: DC
  Administered 2012-10-07: 14:00:00 via INTRAVENOUS

## 2012-10-07 MED ORDER — LIDOCAINE HCL 4 % MT SOLN
OROMUCOSAL | Status: DC | PRN
  Administered 2012-10-07: 4 mL via TOPICAL

## 2012-10-07 MED ORDER — HYDROMORPHONE HCL PF 1 MG/ML IJ SOLN
INTRAMUSCULAR | Status: AC
  Administered 2012-10-07: 0.5 mg via INTRAVENOUS
  Filled 2012-10-07: qty 1

## 2012-10-07 MED ORDER — ENOXAPARIN SODIUM 30 MG/0.3ML ~~LOC~~ SOLN: 30.0000 mg | SUBCUTANEOUS | Status: DC

## 2012-10-07 MED ORDER — BISACODYL 10 MG RE SUPP: 10.0000 mg | Freq: Every day | RECTAL | Status: DC | PRN

## 2012-10-07 MED ORDER — ROCURONIUM BROMIDE 100 MG/10ML IV SOLN
INTRAVENOUS | Status: DC | PRN
  Administered 2012-10-07: 10 mg via INTRAVENOUS
  Administered 2012-10-07: 50 mg via INTRAVENOUS
  Administered 2012-10-07 (×2): 20 mg via INTRAVENOUS
  Administered 2012-10-07: 30 mg via INTRAVENOUS

## 2012-10-07 MED ORDER — CEFAZOLIN SODIUM 1-5 GM-% IV SOLN
1.0000 g | Freq: Four times a day (QID) | INTRAVENOUS | Status: AC
  Administered 2012-10-07 – 2012-10-08 (×3): 1 g via INTRAVENOUS
  Filled 2012-10-07 (×3): qty 50

## 2012-10-07 MED ORDER — SENNOSIDES-DOCUSATE SODIUM 8.6-50 MG PO TABS: 1.0000 | ORAL_TABLET | Freq: Every evening | ORAL | Status: DC | PRN

## 2012-10-07 MED ORDER — LACTATED RINGERS IV SOLN
INTRAVENOUS | Status: DC | PRN
  Administered 2012-10-07 (×2): via INTRAVENOUS

## 2012-10-07 MED ORDER — HYDROMORPHONE 0.3 MG/ML IV SOLN
INTRAVENOUS | Status: DC
  Administered 2012-10-08: 07:00:00 via INTRAVENOUS
  Administered 2012-10-08: 2.1 mg via INTRAVENOUS
  Administered 2012-10-08: via INTRAVENOUS
  Administered 2012-10-08: 2.1 mg via INTRAVENOUS
  Administered 2012-10-08: 6.3 mg via INTRAVENOUS
  Administered 2012-10-08: 15:00:00 via INTRAVENOUS
  Administered 2012-10-08: 3.3 mg via INTRAVENOUS
  Administered 2012-10-08: 2.7 mg via INTRAVENOUS
  Administered 2012-10-08: 4.2 mg via INTRAVENOUS
  Administered 2012-10-09: 2.1 mg via INTRAVENOUS
  Administered 2012-10-09: 18:00:00 via INTRAVENOUS
  Administered 2012-10-09 (×2): 3.6 mg via INTRAVENOUS
  Administered 2012-10-09: 07:00:00 via INTRAVENOUS
  Administered 2012-10-10: 3.6 mg via INTRAVENOUS
  Administered 2012-10-10: 2.7 mg via INTRAVENOUS
  Administered 2012-10-10: 02:00:00 via INTRAVENOUS
  Filled 2012-10-07 (×6): qty 25

## 2012-10-07 MED ORDER — ACETAMINOPHEN 10 MG/ML IV SOLN
INTRAVENOUS | Status: DC | PRN
  Administered 2012-10-07: 1000 mg via INTRAVENOUS

## 2012-10-07 MED ORDER — NEOSTIGMINE METHYLSULFATE 1 MG/ML IJ SOLN
INTRAMUSCULAR | Status: DC | PRN
  Administered 2012-10-07: 4 mg via INTRAVENOUS

## 2012-10-07 MED ORDER — PROPOFOL 10 MG/ML IV BOLUS
INTRAVENOUS | Status: DC | PRN
  Administered 2012-10-07: 140 mg via INTRAVENOUS

## 2012-10-07 MED ORDER — METOCLOPRAMIDE HCL 5 MG/ML IJ SOLN: 5.0000 mg | Freq: Three times a day (TID) | INTRAMUSCULAR | Status: DC | PRN

## 2012-10-07 MED ORDER — METOCLOPRAMIDE HCL 10 MG PO TABS: 5.0000 mg | ORAL_TABLET | Freq: Three times a day (TID) | ORAL | Status: DC | PRN

## 2012-10-07 MED ORDER — FENTANYL CITRATE 0.05 MG/ML IJ SOLN
INTRAMUSCULAR | Status: DC | PRN
  Administered 2012-10-07 (×6): 50 ug via INTRAVENOUS

## 2012-10-07 MED ORDER — ONDANSETRON HCL 4 MG/2ML IJ SOLN: 4.0000 mg | Freq: Four times a day (QID) | INTRAMUSCULAR | Status: DC | PRN

## 2012-10-07 MED ORDER — CEFAZOLIN SODIUM-DEXTROSE 2-3 GM-% IV SOLR
INTRAVENOUS | Status: AC
  Administered 2012-10-07: 2 g via INTRAVENOUS
  Filled 2012-10-07: qty 50

## 2012-10-07 MED ORDER — LIDOCAINE HCL (CARDIAC) 20 MG/ML IV SOLN
INTRAVENOUS | Status: DC | PRN
  Administered 2012-10-07: 60 mg via INTRAVENOUS

## 2012-10-07 MED ORDER — ACETAMINOPHEN 10 MG/ML IV SOLN
INTRAVENOUS | Status: AC
  Filled 2012-10-07: qty 100

## 2012-10-07 MED ORDER — FLEET ENEMA 7-19 GM/118ML RE ENEM: 1.0000 | ENEMA | Freq: Once | RECTAL | Status: AC | PRN

## 2012-10-07 MED ORDER — GLYCOPYRROLATE 0.2 MG/ML IJ SOLN
INTRAMUSCULAR | Status: DC | PRN
  Administered 2012-10-07: 0.6 mg via INTRAVENOUS

## 2012-10-07 MED ORDER — OXYCODONE-ACETAMINOPHEN 5-325 MG PO TABS
1.0000 | ORAL_TABLET | ORAL | Status: DC | PRN
  Filled 2012-10-07: qty 2

## 2012-10-07 MED ORDER — DIPHENHYDRAMINE HCL 12.5 MG/5ML PO ELIX
12.5000 mg | ORAL_SOLUTION | Freq: Four times a day (QID) | ORAL | Status: DC | PRN
  Filled 2012-10-07: qty 10

## 2012-10-07 MED ORDER — ZOLPIDEM TARTRATE 5 MG PO TABS: 5.0000 mg | ORAL_TABLET | Freq: Every evening | ORAL | Status: DC | PRN

## 2012-10-07 MED ORDER — ONDANSETRON HCL 4 MG/2ML IJ SOLN: 4.0000 mg | Freq: Once | INTRAMUSCULAR | Status: DC | PRN

## 2012-10-07 MED ORDER — KCL IN DEXTROSE-NACL 20-5-0.45 MEQ/L-%-% IV SOLN
INTRAVENOUS | Status: DC
  Administered 2012-10-07: 23:00:00 via INTRAVENOUS
  Filled 2012-10-07 (×3): qty 1000

## 2012-10-07 MED ORDER — HYDROCODONE-ACETAMINOPHEN 5-325 MG PO TABS: 1.0000 | ORAL_TABLET | ORAL | Status: DC | PRN

## 2012-10-07 MED ORDER — DIPHENHYDRAMINE HCL 50 MG/ML IJ SOLN: 12.5000 mg | Freq: Four times a day (QID) | INTRAMUSCULAR | Status: DC | PRN

## 2012-10-07 MED ORDER — METHOCARBAMOL 100 MG/ML IJ SOLN
500.0000 mg | Freq: Four times a day (QID) | INTRAVENOUS | Status: DC | PRN
  Filled 2012-10-07: qty 5

## 2012-10-07 MED ORDER — ONDANSETRON HCL 4 MG/2ML IJ SOLN
INTRAMUSCULAR | Status: DC | PRN
  Administered 2012-10-07: 4 mg via INTRAVENOUS

## 2012-10-07 MED ORDER — KETOROLAC TROMETHAMINE 30 MG/ML IJ SOLN: 30.0000 mg | Freq: Once | INTRAMUSCULAR | Status: AC

## 2012-10-07 MED ORDER — 0.9 % SODIUM CHLORIDE (POUR BTL) OPTIME
TOPICAL | Status: DC | PRN
  Administered 2012-10-07: 1000 mL

## 2012-10-07 SURGICAL SUPPLY — 82 items
BANDAGE ELASTIC 4 VELCRO ST LF (GAUZE/BANDAGES/DRESSINGS) ×1 IMPLANT
BANDAGE ELASTIC 6 VELCRO ST LF (GAUZE/BANDAGES/DRESSINGS) ×1 IMPLANT
BANDAGE ESMARK 6X9 LF (GAUZE/BANDAGES/DRESSINGS) IMPLANT
BANDAGE GAUZE ELAST BULKY 4 IN (GAUZE/BANDAGES/DRESSINGS) ×1 IMPLANT
BIT DRILL 2.5X2.75 QC CALB (BIT) ×1 IMPLANT
BNDG CMPR 9X6 STRL LF SNTH (GAUZE/BANDAGES/DRESSINGS)
BNDG ESMARK 6X9 LF (GAUZE/BANDAGES/DRESSINGS)
CLOTH BEACON ORANGE TIMEOUT ST (SAFETY) ×2 IMPLANT
COVER MAYO STAND STRL (DRAPES) ×2 IMPLANT
COVER SURGICAL LIGHT HANDLE (MISCELLANEOUS) ×2 IMPLANT
CUFF TOURNIQUET SINGLE 34IN LL (TOURNIQUET CUFF) IMPLANT
CUFF TOURNIQUET SINGLE 44IN (TOURNIQUET CUFF) IMPLANT
DRAPE C-ARM 42X72 X-RAY (DRAPES) ×1 IMPLANT
DRAPE INCISE IOBAN 66X45 STRL (DRAPES) ×2 IMPLANT
DRAPE PROXIMA HALF (DRAPES) ×2 IMPLANT
DRAPE U-SHAPE 47X51 STRL (DRAPES) ×2 IMPLANT
DRSG PAD ABDOMINAL 8X10 ST (GAUZE/BANDAGES/DRESSINGS) IMPLANT
DURAPREP 26ML APPLICATOR (WOUND CARE) ×3 IMPLANT
ELECT REM PT RETURN 9FT ADLT (ELECTROSURGICAL) ×2
ELECTRODE REM PT RTRN 9FT ADLT (ELECTROSURGICAL) ×1 IMPLANT
EVACUATOR 1/8 PVC DRAIN (DRAIN) ×1 IMPLANT
GAUZE XEROFORM 5X9 LF (GAUZE/BANDAGES/DRESSINGS) ×1 IMPLANT
GLOVE BIOGEL PI IND STRL 6.5 (GLOVE) IMPLANT
GLOVE BIOGEL PI IND STRL 7.5 (GLOVE) ×1 IMPLANT
GLOVE BIOGEL PI IND STRL 8 (GLOVE) ×1 IMPLANT
GLOVE BIOGEL PI INDICATOR 6.5 (GLOVE) ×1
GLOVE BIOGEL PI INDICATOR 7.5 (GLOVE) ×1
GLOVE BIOGEL PI INDICATOR 8 (GLOVE) ×1
GLOVE ECLIPSE 6.5 STRL STRAW (GLOVE) ×2 IMPLANT
GLOVE ECLIPSE 7.0 STRL STRAW (GLOVE) ×3 IMPLANT
GLOVE ORTHO TXT STRL SZ7.5 (GLOVE) ×3 IMPLANT
GOWN PREVENTION PLUS LG XLONG (DISPOSABLE) ×1 IMPLANT
GOWN PREVENTION PLUS XLARGE (GOWN DISPOSABLE) ×2 IMPLANT
GOWN STRL NON-REIN LRG LVL3 (GOWN DISPOSABLE) ×6 IMPLANT
K-WIRE ACE 1.6X6 (WIRE) ×8
KIT BASIN OR (CUSTOM PROCEDURE TRAY) ×2 IMPLANT
KIT ROOM TURNOVER OR (KITS) ×2 IMPLANT
KWIRE ACE 1.6X6 (WIRE) IMPLANT
MANIFOLD NEPTUNE II (INSTRUMENTS) ×2 IMPLANT
NS IRRIG 1000ML POUR BTL (IV SOLUTION) ×2 IMPLANT
PACK ORTHO EXTREMITY (CUSTOM PROCEDURE TRAY) ×2 IMPLANT
PAD ARMBOARD 7.5X6 YLW CONV (MISCELLANEOUS) ×4 IMPLANT
PAD CAST 4YDX4 CTTN HI CHSV (CAST SUPPLIES) ×1 IMPLANT
PADDING CAST COTTON 4X4 STRL (CAST SUPPLIES) ×2
PADDING CAST COTTON 6X4 STRL (CAST SUPPLIES) ×1 IMPLANT
PLATE 9H RT DIST ANTLAT TIB (Plate) ×2 IMPLANT
PLATE ANTLAT CNTR NAR 156X9 (Plate) ×1 IMPLANT
SCREW CORT FT 32X3.5XNONLOCK (Screw) ×2 IMPLANT
SCREW CORTICAL 3.5MM  28MM (Screw) ×1 IMPLANT
SCREW CORTICAL 3.5MM  30MM (Screw) ×1 IMPLANT
SCREW CORTICAL 3.5MM  32MM (Screw) ×2 IMPLANT
SCREW CORTICAL 3.5MM  34MM (Screw) ×2 IMPLANT
SCREW CORTICAL 3.5MM  42MM (Screw) ×1 IMPLANT
SCREW CORTICAL 3.5MM 22MM (Screw) ×2 IMPLANT
SCREW CORTICAL 3.5MM 24MM (Screw) ×1 IMPLANT
SCREW CORTICAL 3.5MM 28MM (Screw) IMPLANT
SCREW CORTICAL 3.5MM 30MM (Screw) ×1 IMPLANT
SCREW CORTICAL 3.5MM 32MM (Screw) ×2 IMPLANT
SCREW CORTICAL 3.5MM 34MM (Screw) IMPLANT
SCREW CORTICAL 3.5MM 38MM (Screw) ×1 IMPLANT
SCREW CORTICAL 3.5MM 42MM (Screw) IMPLANT
SCREW LOCK CORT STAR 3.5X18 (Screw) ×1 IMPLANT
SCREW LOCK CORT STAR 3.5X20 (Screw) ×1 IMPLANT
SCREW LOCK CORT STAR 3.5X22 (Screw) ×2 IMPLANT
SCREW LOCK CORT STAR 3.5X28 (Screw) ×1 IMPLANT
SCREW LOCK CORT STAR 3.5X30 (Screw) ×2 IMPLANT
SCREW LOCK CORT STAR 3.5X32 (Screw) ×4 IMPLANT
SCREW LOCK CORT STAR 3.5X40 (Screw) ×1 IMPLANT
SPONGE GAUZE 4X4 12PLY (GAUZE/BANDAGES/DRESSINGS) ×2 IMPLANT
SPONGE LAP 18X18 X RAY DECT (DISPOSABLE) ×2 IMPLANT
STAPLER VISISTAT 35W (STAPLE) ×2 IMPLANT
SUCTION FRAZIER TIP 10 FR DISP (SUCTIONS) ×3 IMPLANT
SUT ETHILON 1 TP 1 60 (SUTURE) ×2 IMPLANT
SUT ETHILON 3 0 PS 1 (SUTURE) ×4 IMPLANT
SUT VIC AB 2-0 CT1 27 (SUTURE) ×4
SUT VIC AB 2-0 CT1 TAPERPNT 27 (SUTURE) ×2 IMPLANT
TOWEL OR 17X24 6PK STRL BLUE (TOWEL DISPOSABLE) ×2 IMPLANT
TOWEL OR 17X26 10 PK STRL BLUE (TOWEL DISPOSABLE) ×2 IMPLANT
TRAY FOLEY BAG SILVER LF 14FR (CATHETERS) ×1 IMPLANT
TUBE CONNECTING 12X1/4 (SUCTIONS) ×2 IMPLANT
WATER STERILE IRR 1000ML POUR (IV SOLUTION) ×2 IMPLANT
YANKAUER SUCT BULB TIP NO VENT (SUCTIONS) ×2 IMPLANT

## 2012-10-07 NOTE — Transfer of Care (Signed)
Immediate Anesthesia Transfer of Care Note  Patient: George Harrison  Procedure(s) Performed: Procedure(s): OPEN REDUCTION INTERNAL FIXATION (ORIF) TIBIA FRACTURE (Right) REMOVAL EXTERNAL FIXATION LEG (Right)  Patient Location: PACU  Anesthesia Type:General  Level of Consciousness: awake, alert  and oriented  Airway & Oxygen Therapy: Patient connected to face mask  Post-op Assessment: Report given to PACU RN  Post vital signs: stable  Complications: No apparent anesthesia complications

## 2012-10-07 NOTE — Brief Op Note (Signed)
10/02/2012 - 10/07/2012  6:42 PM  PATIENT:  George Harrison  22 y.o. male  PRE-OPERATIVE DIAGNOSIS:  Bilateral Distal Tibia Fractures  POST-OPERATIVE DIAGNOSIS:  Bilateral Distal Tibia Fractures  PROCEDURE:  Procedure(s): OPEN REDUCTION INTERNAL FIXATION (ORIF) TIBIA FRACTURE (Right) REMOVAL EXTERNAL FIXATION LEG (Right)  SURGEON:  Surgeon(s) and Role:    * Eldred Manges, MD - Primary  PHYSICIAN ASSISTANT:   ASSISTANTS: Maud Deed PA-C   ANESTHESIA:   general  EBL:  Total I/O In: 1500 [I.V.:1500] Out: 1000 [Urine:900; Blood:100]  BLOOD ADMINISTERED:none  DRAINS: one HV leg  LOCAL MEDICATIONS USED:  NONE  SPECIMEN:  No Specimen  DISPOSITION OF SPECIMEN:  N/A  COUNTS:  YES  TOURNIQUET:   Total Tourniquet Time Documented: Thigh (Right) - 105 minutes Total: Thigh (Right) - 105 minutes   DICTATION: .Reubin Milan Dictation  PLAN OF CARE: still inpatient  PATIENT DISPOSITION:  PACU - hemodynamically stable.   Delay start of Pharmacological VTE agent (>24hrs) due to surgical blood loss or risk of bleeding: yes

## 2012-10-07 NOTE — Progress Notes (Signed)
Patient currrently not on the schedule.  Good pulses bilaterally.  This patient has been seen and I agree with the findings and treatment plan.  Marta Lamas. Gae Bon, MD, FACS [phone] (pager) [phone] (direct pager) Trauma Surgeon

## 2012-10-07 NOTE — Progress Notes (Signed)
Right tibia ORIF done  . Right external fixator removed. Posted left tibia for Wednesday at 12:30 PM for plating and left external fixator removal.

## 2012-10-07 NOTE — H&P (View-Only) (Signed)
Patient ID: George Harrison, male   DOB: 03/10/1991, 22 y.o.   MRN: [id number] Right and left foot sensation intact. Surgery will be on Monday afternoon, ORIF of both distal tibia fractures. Discussed with pt plan, risks etc. He requests we proceed.  

## 2012-10-07 NOTE — Progress Notes (Signed)
Report given to Asher Muir RN to transport patient back to his room, family updated on return to room

## 2012-10-07 NOTE — Anesthesia Preprocedure Evaluation (Addendum)
Anesthesia Evaluation  Patient identified by MRN, date of birth, ID band Patient awake    Reviewed: Allergy & Precautions, H&P , NPO status , Patient's Chart, lab work & pertinent test results, reviewed documented beta blocker date and time   Airway Mallampati: II      Dental  (+) Teeth Intact and Dental Advisory Given   Pulmonary Current Smoker,  breath sounds clear to auscultation        Cardiovascular negative cardio ROS  Rhythm:Regular     Neuro/Psych    GI/Hepatic negative GI ROS, Neg liver ROS,   Endo/Other  negative endocrine ROS  Renal/GU negative Renal ROS  negative genitourinary   Musculoskeletal negative musculoskeletal ROS (+)   Abdominal (+)  Abdomen: soft. Bowel sounds: normal.  Peds  Hematology negative hematology ROS (+)   Anesthesia Other Findings   Reproductive/Obstetrics negative OB ROS                         Anesthesia Physical Anesthesia Plan  ASA: I  Anesthesia Plan: General   Post-op Pain Management:    Induction: Intravenous  Airway Management Planned: Oral ETT  Additional Equipment:   Intra-op Plan:   Post-operative Plan: Extubation in OR  Informed Consent: I have reviewed the patients History and Physical, chart, labs and discussed the procedure including the risks, benefits and alternatives for the proposed anesthesia with the patient or authorized representative who has indicated his/her understanding and acceptance.     Plan Discussed with:   Anesthesia Plan Comments:         Anesthesia Quick Evaluation

## 2012-10-07 NOTE — Clinical Social Work Note (Signed)
Clinical Social Worker continuing to follow patient for support and discharge planning.  CSW spoke with [facility] security Orlie Pollen) who provided permission for CSW to contact [facility] Department regarding concerns about an outstanding warrant for arrest.  CSW contacted Detective Montez Morita who states that patient does have a warrant for arrest for failure to appear.  Per [place], patient need for rehab takes precedence over this warrant for arrest.  Patient will not be released into police custody until medically stable to appear with magistrate.  Patient currently in the OR, however CSW will discuss patient need for potential rehab needs at discharge.  CSW remains available for support and to facilitate patient discharge needs once medically stable.  Macario Golds, [place] [phone]

## 2012-10-07 NOTE — Interval H&P Note (Signed)
History and Physical Interval Note:  10/07/2012 2:42 PM  George Harrison  has presented today for surgery, with the diagnosis of Bilateral Distal Tibia Fractures  The various methods of treatment have been discussed with the patient and family. After consideration of risks, benefits and other options for treatment, the patient has consented to  Procedure(s): OPEN REDUCTION INTERNAL FIXATION (ORIF) TIBIA FRACTURE (Bilateral) as a surgical intervention .  The patient's history has been reviewed, patient examined, no change in status, stable for surgery.  I have reviewed the patient's chart and labs.  Questions were answered to the patient's satisfaction.     Jlee Harkless C

## 2012-10-07 NOTE — Progress Notes (Signed)
Patient ID: George Harrison, male   DOB: 11-15-1990, 22 y.o.   MRN: [id number]   LOS: 5 days   Subjective: Pt asleep, significant other at bedside. Easily awakened, alert and oriented.  No complaints.  Pain under good control.    Objective: Vital signs in last 24 hours: Temp:  [98.2 F (36.8 C)-99.3 F (37.4 C)] 98.4 F (36.9 C) (04/28 0622) Pulse Rate:  [75-90] 89 (04/28 0622) Resp:  [16-18] 16 (04/28 0622) BP: (143-156)/(68-84) 147/74 mmHg (04/28 0622) SpO2:  [100 %] 100 % (04/28 0622) Last BM Date: 09/30/12   General appearance: alert and oriented.  Calm and cooperative  No acute distress. VSS.  Afebrile. Resp: clear to auscultation bilaterally  Cardio: S1S1 RRR without murmurs or gallops.  +thrill.  Mild BLE edema GI: soft round and nontender.  +BS x4 quadrants.  No organomegaly, hernias or masses. Extremities: BLE ex fix, feet and ankles edematous, sensation intact  Pulses: +2 bilateral distal pulses without cyanosis Neurologic: Mental status: Alert, oriented, thought content appropriate  Sensory: toes sensate and move    Assessment/Plan: has Fall; L1 vertebral fracture; L2 vertebral fracture; L3 vertebral fracture; Sacral fracture; Fracture of multiple pubic rami; Closed pilon fracture of left tibia; Closed pilon fracture of right tibia; and Fracture of talus of right ankle, closed on his problem list.  Patient Active Problem List  Diagnosis  . Fall  . L1 vertebral fracture  . L2 vertebral fracture  . L3 vertebral fracture  . Sacral fracture  . Fracture of multiple pubic rami  . Closed pilon fracture of left tibia  . Closed pilon fracture of right tibia  . Fracture of talus of right ankle, closed   1. s/p Procedure(s):  Application of bilateral external fixators Across Ankles   Fall (Jump from train tressel)  B ankle FX - S/P B ex fix by Dr. Ophelia Charter, Dr Carola Frost to eval. Possible OR on Monday for repair  Verticle sheer pelvic FX - NWB  L1 comp FX and superior endplate  FXs L1-3 - per Dr. Franky Macho  Mild anemia: continue to monitor CBC. FEN - tolerating diet  VTE - Lovenox started 4/25.  Next dose due at 1000, dose will need to held if surgery is planned today.  LOS: 5 days  Ashok Norris, ANP-BC Pager: [phone] General Trauma PA Pager: [phone]   10/07/2012

## 2012-10-07 NOTE — Anesthesia Procedure Notes (Signed)
Procedure Name: Intubation Date/Time: 10/07/2012 3:02 PM Performed by: Ellin Goodie Pre-anesthesia Checklist: Patient identified, Emergency Drugs available, Suction available, Patient being monitored and Timeout performed Patient Re-evaluated:Patient Re-evaluated prior to inductionOxygen Delivery Method: Circle system utilized Preoxygenation: Pre-oxygenation with 100% oxygen Intubation Type: IV induction Ventilation: Mask ventilation without difficulty Laryngoscope Size: Mac and 3 Grade View: Grade I Tube type: Oral Tube size: 7.0 mm Number of attempts: 1 Airway Equipment and Method: Stylet Placement Confirmation: ETT inserted through vocal cords under direct vision,  positive ETCO2 and breath sounds checked- equal and bilateral Secured at: 23 cm Tube secured with: Tape Dental Injury: Teeth and Oropharynx as per pre-operative assessment  Comments: Easy atraumatic intubation with MAC 3 blade, LTA utilized.  Dr. Katrinka Blazing verified placement.  Carlynn Herald, CRNA

## 2012-10-07 NOTE — Anesthesia Postprocedure Evaluation (Signed)
Anesthesia Post Note  Patient: George Harrison  Procedure(s) Performed: Procedure(s) (LRB): OPEN REDUCTION INTERNAL FIXATION (ORIF) TIBIA FRACTURE (Right) REMOVAL EXTERNAL FIXATION LEG (Right)  Anesthesia type: General  Patient location: PACU  Post pain: Pain level controlled and Adequate analgesia  Post assessment: Post-op Vital signs reviewed, Patient's Cardiovascular Status Stable, Respiratory Function Stable, Patent Airway and Pain level controlled  Last Vitals:  Filed Vitals:   10/07/12 1915  BP: 170/92  Pulse: 59  Temp:   Resp: 13    Post vital signs: Reviewed and stable  Level of consciousness: awake, alert  and oriented  Complications: No apparent anesthesia complications

## 2012-10-07 NOTE — Preoperative (Signed)
Beta Blockers   Reason not to administer Beta Blockers:Not Applicable 

## 2012-10-08 ENCOUNTER — Encounter (HOSPITAL_COMMUNITY): Payer: Self-pay | Admitting: Orthopaedic Surgery

## 2012-10-08 DIAGNOSIS — E871 Hypo-osmolality and hyponatremia: Secondary | ICD-10-CM

## 2012-10-08 LAB — COMPREHENSIVE METABOLIC PANEL
Albumin: 2.9 g/dL — ABNORMAL LOW (ref 3.5–5.2)
Alkaline Phosphatase: 47 U/L (ref 39–117)
BUN: 10 mg/dL (ref 6–23)
Calcium: 8.7 mg/dL (ref 8.4–10.5)
Creatinine, Ser: 0.52 mg/dL (ref 0.50–1.35)
GFR calc Af Amer: >90 mL/min (ref >90)
Glucose, Bld: 126 mg/dL — ABNORMAL HIGH (ref 70–99)
Potassium: 4 mEq/L (ref 3.5–5.1)
Total Protein: 6.4 g/dL (ref 6.0–8.3)

## 2012-10-08 LAB — CBC
HCT: 25.8 % — ABNORMAL LOW (ref 39.0–52.0)
Hemoglobin: 9.3 g/dL — ABNORMAL LOW (ref 13.0–17.0)
MCH: 30.1 pg (ref 26.0–34.0)
MCV: 83.5 fL (ref 78.0–100.0)
RBC: 3.09 MIL/uL — ABNORMAL LOW (ref 4.22–5.81)

## 2012-10-08 NOTE — Progress Notes (Signed)
Patient has one external fixator, and an ACE splint. PAS hose for mechanical VTE are unable to be applied because od these two things. Thanks, Nursing
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
## 2012-10-08 NOTE — Clinical Social Work Note (Signed)
Clinical Social Worker met with patient again at bedside to offer support and further discuss plans at discharge.  Patient is agreeable with communication to family members to determine patient plan of care at discharge.  CSW stressed the importance of patient having a place to stay on the first floor while he is bilaterally nonweightbearing - patient does not see the same concern but states that he will speak with his aunt and discuss the possibility of staying with her short term.  Patient friend's mother [name] [phone]) is a CNA and is willing to assist patient and provide transportation to patient follow up appointments.  CSW spoke with her over the phone per patient request and she has asked that MD/PA follow up with her describing patient needs at discharge - CSW to notify of request.  CSW offered option for SNF placement, however patient adamantly refused and states "I will find somewhere to stay on the first floor."  CSW will remain available for support and to assist with continued discharge needs.  George Harrison, George Harrison [phone]

## 2012-10-08 NOTE — Progress Notes (Signed)
Back to OR with Dr. Ophelia Charter tomorrow. Checking with family members regarding the possibility of D/C in [facility] later this week. Patient examined and I agree with the assessment and plan  Violeta Gelinas, MD, MPH, FACS Pager: [phone]  10/08/2012 3:13 PM

## 2012-10-08 NOTE — Progress Notes (Signed)
Pin site care completed by RN.

## 2012-10-08 NOTE — Progress Notes (Signed)
Subjective: 1 Day Post-Op Procedure(s) (LRB): OPEN REDUCTION INTERNAL FIXATION (ORIF) TIBIA FRACTURE (Right) REMOVAL EXTERNAL FIXATION LEG (Right) Patient reports pain as moderate.    Objective: Vital signs in last 24 hours: Temp:  [97.7 F (36.5 C)-98.8 F (37.1 C)] 98.3 F (36.8 C) (04/29 0559) Pulse Rate:  [59-99] 89 (04/29 0559) Resp:  [12-16] 16 (04/29 0559) BP: (140-172)/(70-92) 148/76 mmHg (04/29 0559) SpO2:  [100 %] 100 % (04/29 0559)  Intake/Output from previous day: 04/28 0701 - 04/29 0700 In: 2490 [P.O.:240; I.V.:2250] Out: 1700 [Urine:1600; Blood:100] Intake/Output this shift:     Recent Labs  10/07/12 2140 10/08/12 0539  HGB 9.9* 9.3*    Recent Labs  10/07/12 2140 10/08/12 0539  WBC 11.4* 10.6*  RBC 3.26* 3.09*  HCT 27.2* 25.8*  PLT 153 158    Recent Labs  10/07/12 2140 10/08/12 0539  NA  --  131*  K  --  4.0  CL  --  96  CO2  --  30  BUN  --  10  CREATININE 0.58 0.52  GLUCOSE  --  126*  CALCIUM  --  8.7   No results found for this basename: LABPT, INR,  in the last 72 hours  Neurologically intact  Assessment/Plan: 1 Day Post-Op Procedure(s) (LRB): OPEN REDUCTION INTERNAL FIXATION (ORIF) TIBIA FRACTURE (Right) REMOVAL EXTERNAL FIXATION LEG (Right) Plan  Surgery Wednesday at 12:30PM for left ankle ORIF reviewed xrays with pt from right ankle ORIF.   George Harrison C 10/08/2012, 8:07 AM  

## 2012-10-08 NOTE — Evaluation (Signed)
Physical Therapy Evaluation Patient Details Name: JERRIC OYEN MRN: [id number] DOB: 04-Mar-1991 Today's Date: 10/08/2012 Time: 4098-1191 PT Time Calculation (min): 22 min  PT Assessment / Plan / Recommendation Clinical Impression  Pt is a 22 y.o. male adm to [facility] secondary to fall from bridge resulting in bil distal tibia fx. Pt is s/p L ORIF and R external fixator removal. Pt is NWB on bil LEs and presents with decreased mobility and decreased indpendence with gt. Pt is limited secondary to pain and fatigue. Spoke with pt and his aunt, the aunt has requested to speak to case manager to discuss D/C recommendations due to the fact pt lives on second story apt. Case management notified. Pt to benefit from skilled PT to maxmize functional mobility. D/C dispostion unclear at this time. Will f/u with family and case manager to determine discharge disposition.       PT Assessment  Patient needs continued PT services    Follow Up Recommendations  Home health PT;Supervision/Assistance - 24 hour;Other (comment) (Will need to discuss with case management and family )    Does the patient have the potential to tolerate intense rehabilitation      Barriers to Discharge        Equipment Recommendations  Wheelchair cushion (measurements PT);Wheelchair (measurements PT);Other (comment) (drop arm commode )    Recommendations for Other Services     Frequency Min 5X/week    Precautions / Restrictions Precautions Precautions: Fall Restrictions Weight Bearing Restrictions: Yes RLE Weight Bearing: Non weight bearing LLE Weight Bearing: Non weight bearing   Pertinent Vitals/Pain 6/10 in R LE; denies pain in L LE; pt using PCA PRN      Mobility  Bed Mobility Bed Mobility: Supine to Sit;Sitting - Scoot to Edge of Bed Supine to Sit: 4: Min guard;With rails;HOB elevated Sitting - Scoot to Edge of Bed: 4: Min guard;With rail Details for Bed Mobility Assistance: required vc's for hand placement and cues  to not put pressure or WB through LEs Transfers Transfers: Counselling psychologist Transfer: To lower surface;4: Min assist Details for Transfer Assistance: required assistance to advance LEs; vc's for hand placement and sequencing; pt requires increased time to complete  Ambulation/Gait Ambulation/Gait Assistance: Not tested (comment) Stairs: No Wheelchair Mobility Wheelchair Mobility: No    Exercises     PT Diagnosis: Acute pain;Difficulty walking  PT Problem List: Decreased strength;Decreased range of motion;Decreased activity tolerance;Decreased safety awareness;Decreased knowledge of use of DME;Decreased mobility;Pain;Decreased knowledge of precautions PT Treatment Interventions: DME instruction;Functional mobility training;Therapeutic activities;Therapeutic exercise;Balance training;Neuromuscular re-education;Patient/family education;Wheelchair mobility training   PT Goals Acute Rehab PT Goals PT Goal Formulation: With patient Time For Goal Achievement: 10/15/12 Potential to Achieve Goals: Good Pt will go Supine/Side to Sit: with modified independence PT Goal: Supine/Side to Sit - Progress: Goal set today Pt will go Sit to Supine/Side: with modified independence PT Goal: Sit to Supine/Side - Progress: Goal set today Pt will Transfer Bed to Chair/Chair to Bed: with supervision PT Transfer Goal: Bed to Chair/Chair to Bed - Progress: Goal set today Pt will Propel Wheelchair: > 150 feet;with modified independence PT Goal: Propel Wheelchair - Progress: Goal set today Pt will Demo (or direct caregiver to perform) Pressure Relief x1 min every 30 minutes : Independently PT Goal: Demo/Caregiver Perform Pressure Relief - Progress: Goal set today  Visit Information  Last PT Received On: 10/08/12 Assistance Needed: +2 PT/OT Co-Evaluation/Treatment: Yes    Subjective Data  Subjective: "my Aunt is who you need to talk to  about this stuff"; agreeable to therapy   Patient Stated Goal: to get better    Prior Functioning  Home Living Lives With: Family Available Help at Discharge: Family;Available 24 hours/day Type of Home: Apartment Home Access: Stairs to enter Entrance Stairs-Number of Steps: 9 Entrance Stairs-Rails: None Home Layout: One level Bathroom Shower/Tub: Forensic scientist: Standard Bathroom Accessibility: Yes How Accessible: Accessible via wheelchair Home Adaptive Equipment: None Prior Function Level of Independence: Independent Able to Take Stairs?: Yes Driving: No Communication Communication: No difficulties Dominant Hand: Right    Cognition  Cognition Arousal/Alertness: Awake/alert Behavior During Therapy: WFL for tasks assessed/performed Overall Cognitive Status: Within Functional Limits for tasks assessed    Extremity/Trunk Assessment Right Upper Extremity Assessment RUE ROM/Strength/Tone: [facility] for tasks assessed Left Upper Extremity Assessment LUE ROM/Strength/Tone: WFL for tasks assessed Right Lower Extremity Assessment RLE ROM/Strength/Tone: Unable to fully assess;Due to pain;Due to precautions RLE ROM/Strength/Tone Deficits: knee WFL; unable to assess ankle  RLE Sensation: WFL - Light Touch (on metatarsals ) Left Lower Extremity Assessment LLE ROM/Strength/Tone: Unable to fully assess;Due to pain;Due to precautions LLE Sensation: WFL - Light Touch (on metatarsals ) Trunk Assessment Trunk Assessment: Normal   Balance Balance Balance Assessed: No  End of Session PT - End of Session Activity Tolerance: Patient limited by fatigue Patient left: in chair;with call bell/phone within reach Nurse Communication: Mobility status;Other (comment) (to nurse tech )  GP     Shelva Majestic [place], [place] [phone] 10/08/2012, 1:00 PM

## 2012-10-09 ENCOUNTER — Inpatient Hospital Stay (HOSPITAL_COMMUNITY): Payer: MEDICAID

## 2012-10-09 ENCOUNTER — Encounter (HOSPITAL_COMMUNITY): Payer: Self-pay | Admitting: Anesthesiology

## 2012-10-09 ENCOUNTER — Inpatient Hospital Stay (HOSPITAL_COMMUNITY): Payer: MEDICAID | Admitting: Anesthesiology

## 2012-10-09 ENCOUNTER — Encounter (HOSPITAL_COMMUNITY): Admission: EM | Disposition: A | Discharge status: Home / Self Care | Payer: Self-pay | Source: Home / Self Care

## 2012-10-09 HISTORY — PX: EXTERNAL FIXATION REMOVAL: SHX5040

## 2012-10-09 HISTORY — PX: ORIF TIBIA FRACTURE: SHX5416

## 2012-10-09 LAB — CBC
HCT: 25.4 % — ABNORMAL LOW (ref 39.0–52.0)
Hemoglobin: 9.2 g/dL — ABNORMAL LOW (ref 13.0–17.0)
MCH: 30.5 pg (ref 26.0–34.0)
MCHC: 36.2 g/dL — ABNORMAL HIGH (ref 30.0–36.0)
RBC: 3.02 MIL/uL — ABNORMAL LOW (ref 4.22–5.81)

## 2012-10-09 LAB — COMPREHENSIVE METABOLIC PANEL
ALT: 17 U/L (ref 0–53)
AST: 22 U/L (ref 0–37)
Albumin: 2.8 g/dL — ABNORMAL LOW (ref 3.5–5.2)
Calcium: 9.2 mg/dL (ref 8.4–10.5)
Chloride: 94 mEq/L — ABNORMAL LOW (ref 96–112)
Creatinine, Ser: 0.45 mg/dL — ABNORMAL LOW (ref 0.50–1.35)
Sodium: 130 mEq/L — ABNORMAL LOW (ref 135–145)
Total Bilirubin: 0.7 mg/dL (ref 0.3–1.2)

## 2012-10-09 LAB — CREATININE, SERUM
Creatinine, Ser: 0.56 mg/dL (ref 0.50–1.35)
GFR calc non Af Amer: >90 mL/min (ref >90)

## 2012-10-09 SURGERY — OPEN REDUCTION INTERNAL FIXATION (ORIF) TIBIA FRACTURE
Anesthesia: Regional | Site: Leg Lower | Laterality: Left | Wound class: Clean Contaminated

## 2012-10-09 MED ORDER — FENTANYL CITRATE 0.05 MG/ML IJ SOLN
INTRAMUSCULAR | Status: DC | PRN
  Administered 2012-10-09 (×3): 100 ug via INTRAVENOUS
  Administered 2012-10-09: 50 ug via INTRAVENOUS

## 2012-10-09 MED ORDER — ENOXAPARIN SODIUM 40 MG/0.4ML ~~LOC~~ SOLN
40.0000 mg | SUBCUTANEOUS | Status: DC
  Administered 2012-10-10: 40 mg via SUBCUTANEOUS
  Filled 2012-10-09 (×3): qty 0.4

## 2012-10-09 MED ORDER — BUPIVACAINE HCL (PF) 0.25 % IJ SOLN
INTRAMUSCULAR | Status: AC
  Filled 2012-10-09: qty 30

## 2012-10-09 MED ORDER — ROCURONIUM BROMIDE 100 MG/10ML IV SOLN
INTRAVENOUS | Status: DC | PRN
  Administered 2012-10-09: 40 mg via INTRAVENOUS

## 2012-10-09 MED ORDER — MEPERIDINE HCL 25 MG/ML IJ SOLN: 6.2500 mg | INTRAMUSCULAR | Status: DC | PRN

## 2012-10-09 MED ORDER — CEFAZOLIN SODIUM-DEXTROSE 2-3 GM-% IV SOLR: 2.0000 g | Freq: Once | INTRAVENOUS | Status: DC

## 2012-10-09 MED ORDER — LACTATED RINGERS IV SOLN
INTRAVENOUS | Status: DC | PRN
  Administered 2012-10-09 (×2): via INTRAVENOUS

## 2012-10-09 MED ORDER — LACTATED RINGERS IV SOLN: INTRAVENOUS | Status: DC

## 2012-10-09 MED ORDER — OXYCODONE HCL 5 MG PO TABS: 5.0000 mg | ORAL_TABLET | Freq: Once | ORAL | Status: DC | PRN

## 2012-10-09 MED ORDER — GLYCOPYRROLATE 0.2 MG/ML IJ SOLN
INTRAMUSCULAR | Status: DC | PRN
  Administered 2012-10-09: 0.4 mg via INTRAVENOUS

## 2012-10-09 MED ORDER — CEFAZOLIN SODIUM 1-5 GM-% IV SOLN
1.0000 g | Freq: Four times a day (QID) | INTRAVENOUS | Status: AC
  Administered 2012-10-09 – 2012-10-10 (×3): 1 g via INTRAVENOUS
  Filled 2012-10-09 (×3): qty 50

## 2012-10-09 MED ORDER — PROPOFOL 10 MG/ML IV BOLUS
INTRAVENOUS | Status: DC | PRN
  Administered 2012-10-09: 200 mg via INTRAVENOUS

## 2012-10-09 MED ORDER — NEOSTIGMINE METHYLSULFATE 1 MG/ML IJ SOLN
INTRAMUSCULAR | Status: DC | PRN
  Administered 2012-10-09: 2 mg via INTRAVENOUS

## 2012-10-09 MED ORDER — SODIUM CHLORIDE 0.9 % IR SOLN
Status: DC | PRN
  Administered 2012-10-09 (×2): 1000 mL

## 2012-10-09 MED ORDER — LACTATED RINGERS IV SOLN
INTRAVENOUS | Status: DC
  Administered 2012-10-09: 12:00:00 via INTRAVENOUS

## 2012-10-09 MED ORDER — OXYCODONE HCL 5 MG/5ML PO SOLN: 5.0000 mg | Freq: Once | ORAL | Status: DC | PRN

## 2012-10-09 MED ORDER — WHITE PETROLATUM GEL
Status: AC
  Administered 2012-10-09: 21:00:00
  Filled 2012-10-09: qty 5

## 2012-10-09 MED ORDER — PROMETHAZINE HCL 25 MG/ML IJ SOLN: 6.2500 mg | INTRAMUSCULAR | Status: DC | PRN

## 2012-10-09 MED ORDER — HYDROMORPHONE HCL PF 1 MG/ML IJ SOLN: 0.2500 mg | INTRAMUSCULAR | Status: DC | PRN

## 2012-10-09 MED ORDER — CEFAZOLIN SODIUM-DEXTROSE 2-3 GM-% IV SOLR
INTRAVENOUS | Status: DC | PRN
  Administered 2012-10-09: 2 g via INTRAVENOUS

## 2012-10-09 MED ORDER — LIDOCAINE HCL (CARDIAC) 20 MG/ML IV SOLN
INTRAVENOUS | Status: DC | PRN
  Administered 2012-10-09: 50 mg via INTRAVENOUS

## 2012-10-09 MED ORDER — ACETAMINOPHEN 10 MG/ML IV SOLN: 1000.0000 mg | Freq: Once | INTRAVENOUS | Status: DC | PRN

## 2012-10-09 SURGICAL SUPPLY — 84 items
BANDAGE ELASTIC 4 VELCRO ST LF (GAUZE/BANDAGES/DRESSINGS) ×2 IMPLANT
BANDAGE ELASTIC 6 VELCRO ST LF (GAUZE/BANDAGES/DRESSINGS) ×2 IMPLANT
BANDAGE ESMARK 6X9 LF (GAUZE/BANDAGES/DRESSINGS) IMPLANT
BANDAGE GAUZE ELAST BULKY 4 IN (GAUZE/BANDAGES/DRESSINGS) ×2 IMPLANT
BIT DRILL 2.5X2.75 QC CALB (BIT) ×1 IMPLANT
BIT DRILL 2.9 CANN QC NONSTRL (BIT) ×1 IMPLANT
BIT DRILL CALIBRATED 2.7 (BIT) ×1 IMPLANT
BNDG CMPR 9X6 STRL LF SNTH (GAUZE/BANDAGES/DRESSINGS) ×1
BNDG COHESIVE 6X5 TAN STRL LF (GAUZE/BANDAGES/DRESSINGS) ×1 IMPLANT
BNDG ESMARK 6X9 LF (GAUZE/BANDAGES/DRESSINGS) ×2
CLOTH BEACON ORANGE TIMEOUT ST (SAFETY) ×2 IMPLANT
COVER MAYO STAND STRL (DRAPES) ×2 IMPLANT
COVER SURGICAL LIGHT HANDLE (MISCELLANEOUS) ×2 IMPLANT
CUFF TOURNIQUET SINGLE 34IN LL (TOURNIQUET CUFF) ×1 IMPLANT
CUFF TOURNIQUET SINGLE 44IN (TOURNIQUET CUFF) IMPLANT
DRAPE C-ARM 42X72 X-RAY (DRAPES) ×1 IMPLANT
DRAPE INCISE IOBAN 66X45 STRL (DRAPES) ×2 IMPLANT
DRAPE PROXIMA HALF (DRAPES) ×2 IMPLANT
DRAPE U-SHAPE 47X51 STRL (DRAPES) ×2 IMPLANT
DRSG ADAPTIC 3X8 NADH LF (GAUZE/BANDAGES/DRESSINGS) ×1 IMPLANT
DRSG PAD ABDOMINAL 8X10 ST (GAUZE/BANDAGES/DRESSINGS) ×4 IMPLANT
DURAPREP 26ML APPLICATOR (WOUND CARE) ×2 IMPLANT
ELECT REM PT RETURN 9FT ADLT (ELECTROSURGICAL) ×2
ELECTRODE REM PT RTRN 9FT ADLT (ELECTROSURGICAL) ×1 IMPLANT
GAUZE XEROFORM 5X9 LF (GAUZE/BANDAGES/DRESSINGS) ×2 IMPLANT
GLOVE BIO SURGEON STRL SZ7 (GLOVE) ×2 IMPLANT
GLOVE BIOGEL PI IND STRL 7.0 (GLOVE) IMPLANT
GLOVE BIOGEL PI IND STRL 7.5 (GLOVE) ×1 IMPLANT
GLOVE BIOGEL PI IND STRL 8 (GLOVE) ×1 IMPLANT
GLOVE BIOGEL PI INDICATOR 7.0 (GLOVE) ×1
GLOVE BIOGEL PI INDICATOR 7.5 (GLOVE) ×1
GLOVE BIOGEL PI INDICATOR 8 (GLOVE) ×1
GLOVE ECLIPSE 7.0 STRL STRAW (GLOVE) ×3 IMPLANT
GLOVE ORTHO TXT STRL SZ7.5 (GLOVE) ×2 IMPLANT
GLOVE SURG SS PI 7.0 STRL IVOR (GLOVE) IMPLANT
GOWN PREVENTION PLUS LG XLONG (DISPOSABLE) ×1 IMPLANT
GOWN PREVENTION PLUS XLARGE (GOWN DISPOSABLE) ×2 IMPLANT
GOWN STRL NON-REIN LRG LVL3 (GOWN DISPOSABLE) ×2 IMPLANT
GOWN STRL REIN XL XLG (GOWN DISPOSABLE) ×1 IMPLANT
K-WIRE ACE 1.6X6 (WIRE) ×10
KIT BASIN OR (CUSTOM PROCEDURE TRAY) ×2 IMPLANT
KIT ROOM TURNOVER OR (KITS) ×2 IMPLANT
KWIRE ACE 1.6X6 (WIRE) IMPLANT
MANIFOLD NEPTUNE II (INSTRUMENTS) ×2 IMPLANT
NS IRRIG 1000ML POUR BTL (IV SOLUTION) ×2 IMPLANT
PACK ORTHO EXTREMITY (CUSTOM PROCEDURE TRAY) ×2 IMPLANT
PAD ARMBOARD 7.5X6 YLW CONV (MISCELLANEOUS) ×4 IMPLANT
PAD CAST 4YDX4 CTTN HI CHSV (CAST SUPPLIES) ×1 IMPLANT
PADDING CAST COTTON 4X4 STRL (CAST SUPPLIES)
PADDING CAST COTTON 6X4 STRL (CAST SUPPLIES) ×6 IMPLANT
PENCIL BUTTON HOLSTER BLD 10FT (ELECTRODE) ×1 IMPLANT
PLATE 6H LT DIST ANTLAT TIB (Plate) ×1 IMPLANT
SCREW ACE CAN 4.0 50M (Screw) ×2 IMPLANT
SCREW ACE CAN 4.0 55M (Screw) ×1 IMPLANT
SCREW CORT FT 32X3.5XNONLOCK (Screw) IMPLANT
SCREW CORTICAL 3.5MM  28MM (Screw) ×1 IMPLANT
SCREW CORTICAL 3.5MM  32MM (Screw) ×1 IMPLANT
SCREW CORTICAL 3.5MM 24MM (Screw) ×1 IMPLANT
SCREW CORTICAL 3.5MM 28MM (Screw) IMPLANT
SCREW CORTICAL 3.5MM 32MM (Screw) ×1 IMPLANT
SCREW CORTICAL 3.5MM 38MM (Screw) ×1 IMPLANT
SCREW LOCK CORT STAR 3.5X22 (Screw) ×1 IMPLANT
SCREW LOCK CORT STAR 3.5X28 (Screw) ×2 IMPLANT
SCREW LOCK CORT STAR 3.5X32 (Screw) ×1 IMPLANT
SCREW LOCK CORT STAR 3.5X34 (Screw) ×1 IMPLANT
SCREW LOCK CORT STAR 3.5X36 (Screw) ×1 IMPLANT
SPONGE GAUZE 4X4 12PLY (GAUZE/BANDAGES/DRESSINGS) ×2 IMPLANT
SPONGE LAP 18X18 X RAY DECT (DISPOSABLE) ×2 IMPLANT
SPONGE SCRUB IODOPHOR (GAUZE/BANDAGES/DRESSINGS) ×2 IMPLANT
STAPLER VISISTAT 35W (STAPLE) ×1 IMPLANT
STOCKINETTE IMPERVIOUS LG (DRAPES) ×2 IMPLANT
SUCTION FRAZIER TIP 10 FR DISP (SUCTIONS) ×2 IMPLANT
SUT ETHILON 2 0 PSLX (SUTURE) ×1 IMPLANT
SUT ETHILON 3 0 PS 1 (SUTURE) ×2 IMPLANT
SUT VIC AB 0 CT1 27 (SUTURE)
SUT VIC AB 0 CT1 27XBRD ANBCTR (SUTURE) IMPLANT
SUT VIC AB 2-0 CT1 27 (SUTURE) ×4
SUT VIC AB 2-0 CT1 TAPERPNT 27 (SUTURE) ×2 IMPLANT
TOWEL OR 17X24 6PK STRL BLUE (TOWEL DISPOSABLE) ×4 IMPLANT
TOWEL OR 17X26 10 PK STRL BLUE (TOWEL DISPOSABLE) ×2 IMPLANT
TUBE CONNECTING 12X1/4 (SUCTIONS) ×2 IMPLANT
UNDERPAD 30X30 INCONTINENT (UNDERPADS AND DIAPERS) ×2 IMPLANT
WATER STERILE IRR 1000ML POUR (IV SOLUTION) ×2 IMPLANT
YANKAUER SUCT BULB TIP NO VENT (SUCTIONS) ×2 IMPLANT

## 2012-10-09 NOTE — Anesthesia Postprocedure Evaluation (Signed)
  Anesthesia Post-op Note  Patient: George Harrison  Procedure(s) Performed: Procedure(s): OPEN REDUCTION INTERNAL FIXATION (ORIF) Left Tibial Plafon (Left) REMOVAL EXTERNAL FIXATION LEG (Left)  Patient Location: PACU  Anesthesia Type:General  Level of Consciousness: awake, alert , oriented and patient cooperative  Airway and Oxygen Therapy: Patient Spontanous Breathing  Post-op Pain: none  Post-op Assessment: Post-op Vital signs reviewed, Patient's Cardiovascular Status Stable, Respiratory Function Stable, Patent Airway, No signs of Nausea or vomiting and Pain level controlled  Post-op Vital Signs: Reviewed and stable  Complications: No apparent anesthesia complications

## 2012-10-09 NOTE — H&P (View-Only) (Signed)
Subjective: 1 Day Post-Op Procedure(s) (LRB): OPEN REDUCTION INTERNAL FIXATION (ORIF) TIBIA FRACTURE (Right) REMOVAL EXTERNAL FIXATION LEG (Right) Patient reports pain as moderate.    Objective: Vital signs in last 24 hours: Temp:  [97.7 F (36.5 C)-98.8 F (37.1 C)] 98.3 F (36.8 C) (04/29 0559) Pulse Rate:  [59-99] 89 (04/29 0559) Resp:  [12-16] 16 (04/29 0559) BP: (140-172)/(70-92) 148/76 mmHg (04/29 0559) SpO2:  [100 %] 100 % (04/29 0559)  Intake/Output from previous day: 04/28 0701 - 04/29 0700 In: 2490 [P.O.:240; I.V.:2250] Out: 1700 [Urine:1600; Blood:100] Intake/Output this shift:     Recent Labs  10/07/12 2140 10/08/12 0539  HGB 9.9* 9.3*    Recent Labs  10/07/12 2140 10/08/12 0539  WBC 11.4* 10.6*  RBC 3.26* 3.09*  HCT 27.2* 25.8*  PLT 153 158    Recent Labs  10/07/12 2140 10/08/12 0539  NA  --  131*  K  --  4.0  CL  --  96  CO2  --  30  BUN  --  10  CREATININE 0.58 0.52  GLUCOSE  --  126*  CALCIUM  --  8.7   No results found for this basename: LABPT, INR,  in the last 72 hours  Neurologically intact  Assessment/Plan: 1 Day Post-Op Procedure(s) (LRB): OPEN REDUCTION INTERNAL FIXATION (ORIF) TIBIA FRACTURE (Right) REMOVAL EXTERNAL FIXATION LEG (Right) Plan  Surgery Wednesday at 12:30PM for left ankle ORIF reviewed xrays with pt from right ankle ORIF.   Estefani Bateson C 10/08/2012, 8:07 AM

## 2012-10-09 NOTE — Progress Notes (Signed)
Stable.  Should be going back to surgery today.  This patient has been seen and I agree with the findings and treatment plan.  George Harrison. Gae Bon, MD, FACS [phone] (pager) [phone] (direct pager) Trauma Surgeon

## 2012-10-09 NOTE — Progress Notes (Signed)
Patient ID: George Harrison, male   DOB: 05/08/1991, 22 y.o.   MRN: [id number]   LOS: 7 days   Subjective: Pt sitting up in chair, appears comfortable.  No n/v.  No bm, +flatus.  Denies chest pains or shortness of breath.    Objective: Vital signs in last 24 hours: Temp:  [98.6 F (37 C)-99.2 F (37.3 C)] 99.2 F (37.3 C) (04/30 0555) Pulse Rate:  [75-98] 98 (04/30 0555) Resp:  [10-16] 11 (04/30 0555) BP: (140-145)/(76-89) 140/76 mmHg (04/30 0555) SpO2:  [100 %] 100 % (04/30 0555) Last BM Date: 09/30/12  Lab Results:  CBC  Recent Labs  10/07/12 2140 10/08/12 0539  WBC 11.4* 10.6*  HGB 9.9* 9.3*  HCT 27.2* 25.8*  PLT 153 158   BMET  Recent Labs  10/08/12 0539 10/09/12 0605  NA 131* 130*  K 4.0 3.8  CL 96 94*  CO2 30 29  GLUCOSE 126* 125*  BUN 10 7  CREATININE 0.52 0.45*  CALCIUM 8.7 9.2    Imaging: Dg Ankle Complete Right  10/07/2012  *RADIOLOGY REPORT*  Clinical Data:Right ankle fracture/dislocation.  ORIF.  DG C-ARM GT 120 MIN, RIGHT ANKLE - COMPLETE 3+ VIEW  Fluoroscopy Time: Technique:  C-arm fluoroscopic images were obtained intraoperatively and submitted for postoperative interpretation.  Please see the performing provider's procedural report for the fluoroscopy time utilized.  Comparison: CT 10/02/2012.  Findings: Five spot fluoroscopic images demonstrate near anatomic reduction of the tibial fractures status post plate and screw fixation.  There is no significant residual displacement of the tibial plafond.  There is no dislocation.  IMPRESSION: Near anatomic reduction of distal tibial fracture status post ORIF.   Original Report Authenticated By: Carey Bullocks, M.D.    Dg C-arm Gt 120 Min  10/07/2012  *RADIOLOGY REPORT*  Clinical Data:Right ankle fracture/dislocation.  ORIF.  DG C-ARM GT 120 MIN, RIGHT ANKLE - COMPLETE 3+ VIEW  Fluoroscopy Time: Technique:  C-arm fluoroscopic images were obtained intraoperatively and submitted for postoperative interpretation.   Please see the performing provider's procedural report for the fluoroscopy time utilized.  Comparison: CT 10/02/2012.  Findings: Five spot fluoroscopic images demonstrate near anatomic reduction of the tibial fractures status post plate and screw fixation.  There is no significant residual displacement of the tibial plafond.  There is no dislocation.  IMPRESSION: Near anatomic reduction of distal tibial fracture status post ORIF.   Original Report Authenticated By: Carey Bullocks, M.D.      PE: General appearance: alert and oriented. Calm and cooperative No acute distress. VSS. Afebrile.  Resp: clear to auscultation bilaterally  Cardio: S1S1 RRR without murmurs or gallops. +thrill. Mild BLE edema  GI: soft round and nontender. +BS x4 quadrants. No organomegaly, hernias or masses.  Extremities: LLE ex fixation, cast right leg, feet and ankles edematous, sensation intact  Pulses: +2 bilateral distal pulses without cyanosis  Neurologic: Mental status: Alert, oriented, thought content appropriate  Sensory: toes sensate and move Psychiatric: appears depressed, dismissive    Patient Active Problem List   Diagnosis Date Noted  . Fall 10/02/2012  . L1 vertebral fracture 10/02/2012  . L2 vertebral fracture 10/02/2012  . L3 vertebral fracture 10/02/2012  . Sacral fracture 10/02/2012  . Fracture of multiple pubic rami 10/02/2012  . Closed pilon fracture of left tibia 10/02/2012  . Closed pilon fracture of right tibia 10/02/2012  . Fracture of talus of right ankle, closed 10/02/2012    Assessment/Plan:  Fall (Jump from train tressel)  Bilateral Distal Tibia  Fractures-ORIF Right Tibia(10/07/12 Dr. Ophelia Charter). Plan for OR today at 1230. B ankle FX - S/P ORIF B ex fix by Dr. Ophelia Charter  Verticle sheer pelvic FX - NWB  L1 comp FX and superior endplate FXs L1-3-per Dr. Bing Plume 4/24)  Mild anemia: stable 4/29, repeat in am Hyponatremia: possibly dilutional, ivf discontinued, will discuss with Dr. Lindie Spruce  as pt is NPO at this time for surgery today. FEN - tolerating diet  VTE - SCDs, lovenox, am dose per ortho recommendation Disposition: PT/OT recommends 24hr supervision and assistance at home.  pt declining inpatient rehab, stated he will find safe and alternative placement.  Mom is able to assist with care, but the amount is unclear. Will continue to follow   Ashok Norris, ANP-BC Pager: [phone] General Trauma PA Pager: [phone]   10/09/2012

## 2012-10-09 NOTE — Interval H&P Note (Signed)
History and Physical Interval Note:  10/09/2012 12:21 PM  [name]  has presented today for surgery, with the diagnosis of Left Tibial Plateau Fx.  The various methods of treatment have been discussed with the patient and family. After consideration of risks, benefits and other options for treatment, the patient has consented to  Procedure(s): OPEN REDUCTION INTERNAL FIXATION (ORIF) Left Tibial Plafon (Left) REMOVAL EXTERNAL FIXATION LEG (Left) as a surgical intervention .  The patient's history has been reviewed, patient examined, no change in status, stable for surgery.  I have reviewed the patient's chart and labs.  Questions were answered to the patient's satisfaction.     Sarin Comunale C

## 2012-10-09 NOTE — Brief Op Note (Cosign Needed)
10/02/2012 - 10/09/2012  3:47 PM  PATIENT:  George Harrison  22 y.o. male  PRE-OPERATIVE DIAGNOSIS:  Left tibia and medial malleolus Fx.  POST-OPERATIVE DIAGNOSIS:  Left tibia and medial malleolus Fx.  PROCEDURE:  Procedure(s): OPEN REDUCTION INTERNAL FIXATION (ORIF) Left Tibial Plafon (Left) REMOVAL EXTERNAL FIXATION LEG (Left)  SURGEON:  Surgeon(s) and Role:    * Eldred Manges, MD - Primary  PHYSICIAN ASSISTANT: Maud Deed PAC  ASSISTANTS: none   ANESTHESIA:   general  EBL:  Total I/O In: 1000 [I.V.:1000] Out: -   BLOOD ADMINISTERED:none  DRAINS: none   LOCAL MEDICATIONS USED:  NONE  SPECIMEN:  No Specimen  DISPOSITION OF SPECIMEN:  N/A  COUNTS:  YES  TOURNIQUET:   Total Tourniquet Time Documented: Thigh (Left) - 68 minutes Total: Thigh (Left) - 68 minutes   DICTATION: .Note written in EPIC  PLAN OF CARE: Admit to inpatient   PATIENT DISPOSITION:  PACU - hemodynamically stable.   Delay start of Pharmacological VTE agent (>24hrs) due to surgical blood loss or risk of bleeding: no

## 2012-10-09 NOTE — Transfer of Care (Signed)
Immediate Anesthesia Transfer of Care Note  Patient: George Harrison  Procedure(s) Performed: Procedure(s): OPEN REDUCTION INTERNAL FIXATION (ORIF) Left Tibial Plafon (Left) REMOVAL EXTERNAL FIXATION LEG (Left)  Patient Location: PACU  Anesthesia Type:General  Level of Consciousness: awake and alert   Airway & Oxygen Therapy: Patient Spontanous Breathing and Patient connected to nasal cannula oxygen  Post-op Assessment: Report given to PACU RN and Post -op Vital signs reviewed and stable  Post vital signs: Reviewed and stable  Complications: No apparent anesthesia complications

## 2012-10-09 NOTE — Progress Notes (Signed)
PT Cancellation Note  Patient Details Name: OMARIUS GRANTHAM MRN: [id number] DOB: 1990-08-24   Cancelled Treatment:    Reason Eval/Treat Not Completed: Patient at procedure or test/unavailable. Pt for surgery today. Will reassess as ordered   Fredrich Birks 10/09/2012, 11:37 AM

## 2012-10-09 NOTE — Anesthesia Preprocedure Evaluation (Addendum)
Anesthesia Evaluation  Patient identified by MRN, date of birth, ID band Patient awake    Reviewed: Allergy & Precautions, H&P , NPO status , Patient's Chart, lab work & pertinent test results, reviewed documented beta blocker date and time   Airway Mallampati: II TM Distance: >3 FB Neck ROM: Full    Dental  (+) Teeth Intact and Dental Advisory Given   Pulmonary Current Smoker,  breath sounds clear to auscultation        Cardiovascular negative cardio ROS  Rhythm:Regular     Neuro/Psych    GI/Hepatic negative GI ROS, Neg liver ROS,   Endo/Other  negative endocrine ROS  Renal/GU negative Renal ROS  negative genitourinary   Musculoskeletal negative musculoskeletal ROS (+)   Abdominal (+)  Abdomen: soft. Bowel sounds: normal.  Peds  Hematology negative hematology ROS (+)   Anesthesia Other Findings   Reproductive/Obstetrics negative OB ROS                          Anesthesia Physical  Anesthesia Plan  ASA: I  Anesthesia Plan: General   Post-op Pain Management:    Induction: Intravenous  Airway Management Planned: LMA  Additional Equipment:   Intra-op Plan:   Post-operative Plan: Extubation in OR  Informed Consent: I have reviewed the patients History and Physical, chart, labs and discussed the procedure including the risks, benefits and alternatives for the proposed anesthesia with the patient or authorized representative who has indicated his/her understanding and acceptance.   Dental advisory given  Plan Discussed with: CRNA  Anesthesia Plan Comments:        Anesthesia Quick Evaluation

## 2012-10-10 ENCOUNTER — Inpatient Hospital Stay (HOSPITAL_COMMUNITY): Payer: MEDICAID

## 2012-10-10 DIAGNOSIS — R000 Tachycardia, unspecified: Secondary | ICD-10-CM

## 2012-10-10 LAB — CBC
HCT: 24.4 % — ABNORMAL LOW (ref 39.0–52.0)
MCV: 83.8 fL (ref 78.0–100.0)
Platelets: 230 10*3/uL (ref 150–400)
RBC: 2.91 MIL/uL — ABNORMAL LOW (ref 4.22–5.81)
WBC: 11.6 10*3/uL — ABNORMAL HIGH (ref 4.0–10.5)

## 2012-10-10 LAB — COMPREHENSIVE METABOLIC PANEL
Albumin: 2.8 g/dL — ABNORMAL LOW (ref 3.5–5.2)
BUN: 7 mg/dL (ref 6–23)
Creatinine, Ser: 0.48 mg/dL — ABNORMAL LOW (ref 0.50–1.35)
GFR calc Af Amer: >90 mL/min (ref >90)
Glucose, Bld: 116 mg/dL — ABNORMAL HIGH (ref 70–99)
Total Protein: 6.6 g/dL (ref 6.0–8.3)

## 2012-10-10 LAB — URINALYSIS, ROUTINE W REFLEX MICROSCOPIC
Hgb urine dipstick: NEGATIVE
Leukocytes, UA: NEGATIVE
Nitrite: NEGATIVE
Protein, ur: NEGATIVE mg/dL
Specific Gravity, Urine: 1.013 (ref 1.005–1.030)
Urobilinogen, UA: 1 mg/dL (ref 0.0–1.0)

## 2012-10-10 MED ORDER — HYDROMORPHONE HCL PF 1 MG/ML IJ SOLN: 1.0000 mg | INTRAMUSCULAR | Status: DC | PRN

## 2012-10-10 NOTE — Progress Notes (Signed)
Physical Therapy Treatment Patient Details Name: RILEN SHUKLA MRN: [id number] DOB: 1990/11/14 Today's Date: 10/10/2012 Time: 1478-2956 PT Time Calculation (min): 20 min  PT Assessment / Plan / Recommendation Comments on Treatment Session  Pt progressing well with therapy. HR reached 130 with activity; pt anxious with movement of bil LEs. Pt will require w/c upon acute D/C for mobility. Pt understands that manuvering w/c up 9 steps will be difficult but states that is his D/C plan. Will cont to f/u with pt to increase independence with transfers.     Follow Up Recommendations  Home health PT;Supervision/Assistance - 24 hour;Other (comment)     Does the patient have the potential to tolerate intense rehabilitation     Barriers to Discharge        Equipment Recommendations  Wheelchair cushion (measurements PT);Wheelchair (measurements PT);Other (comment) (drop arm commode)    Recommendations for Other Services    Frequency Min 5X/week   Plan Discharge plan remains appropriate;Frequency remains appropriate    Precautions / Restrictions Precautions Precautions: Fall Restrictions Weight Bearing Restrictions: Yes RLE Weight Bearing: Non weight bearing LLE Weight Bearing: Non weight bearing   Pertinent Vitals/Pain 1/10 predominately in  L LE  using PCA PRN.     Mobility  Transfers Transfers: Lateral/Scoot Transfers Lateral/Scoot Transfers: 4: Min assist;From elevated surface;With armrests removed Details for Transfer Assistance: pt required assistance for bil LEs to perform lateral transfer; requires LEs to be elevated to reduce pain and for advancement; pt demo good upper body strength to manuver to drop arm chair  Ambulation/Gait Ambulation/Gait Assistance: Not tested (comment)    Exercises     PT Diagnosis:    PT Problem List:   PT Treatment Interventions:     PT Goals Acute Rehab PT Goals PT Goal Formulation: With patient Time For Goal Achievement: 10/15/12 PT Transfer  Goal: Bed to Chair/Chair to Bed - Progress: Progressing toward goal  Visit Information  Last PT Received On: 10/10/12 Assistance Needed: +2 PT/OT Co-Evaluation/Treatment: Yes    Subjective Data  Subjective: Sleeping in bed with GF present; agreeable to therapy Patient Stated Goal:  home with family   Cognition  Cognition Arousal/Alertness: Awake/alert Behavior During Therapy: WFL for tasks assessed/performed Overall Cognitive Status: Within Functional Limits for tasks assessed    Balance     End of Session PT - End of Session Equipment Utilized During Treatment: Oxygen;Other (comment) (2L) Activity Tolerance: Patient tolerated treatment well Patient left: in chair;with call bell/phone within reach;with family/visitor present Nurse Communication: Mobility status   GP     Donell Sievert, [place] [phone] 10/10/2012, 2:20 PM

## 2012-10-10 NOTE — Clinical Social Work Note (Signed)
Clinical Social Worker and CM met with patient at bedside to confirm patient plans at discharge.  Patient states that he has made arrangements to return home with his mother on the 2nd floor apartment with extensive friend and family assistance.  Patient girlfriend in the room and agreed that patient has made arrangements.  CM aware and will provide patient with needed equipment at discharge.    Clinical Social Worker will sign off for now as social work intervention is no longer needed. Please consult [place] again if new need arises.  Macario Golds, [place] [phone]
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
## 2012-10-10 NOTE — Progress Notes (Addendum)
Spoke with pt and girlfriend in the pt's room about HH/DME plans for discharge. He states that he will be returning to his 2nd floor apt at [address], [place], [place] [address].  Phone is [phone].   He will require manual wheelchair with cushion and drop-arm 3-in-1 commode.  Pt has declined the [facility] visits at this time since he is transfers only but stated understanding when I explained that he could speak with ortho surgeon at his follow up appt if he changed his mind.   Pt was emphatic that he has multiple support people that have made arrangements for him to be able to get into his second floor apt, that he has transportation and personnel to get to all of his follow up appointments.

## 2012-10-10 NOTE — Progress Notes (Signed)
Pt received 2.4mg  of Dilaudid through his PCA between 0800 and 1200. PCA discontinued.
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
## 2012-10-10 NOTE — Progress Notes (Signed)
Subjective: 1 Day Post-Op Procedure(s) (LRB): OPEN REDUCTION INTERNAL FIXATION (ORIF) Left Tibial Plafon (Left) REMOVAL EXTERNAL FIXATION LEG (Left) Patient reports pain as moderate.  Sitting in recliner and comfortable Describes numbness of toes. Reports he plans to go home tomorrow with family to help  Objective: Vital signs in last 24 hours: Temp:  [97.3 F (36.3 C)-100.9 F (38.3 C)] 100.9 F (38.3 C) (05/01 0534) Pulse Rate:  [81-112] 112 (05/01 0534) Resp:  [10-18] 11 (05/01 0800) BP: (159-176)/(74-96) 159/74 mmHg (05/01 0534) SpO2:  [100 %] 100 % (05/01 0800)  Intake/Output from previous day: 04/30 0701 - 05/01 0700 In: 1240 [P.O.:240; I.V.:1000] Out: 800 [Urine:800] Intake/Output this shift: Total I/O In: 240 [P.O.:240] Out: 400 [Urine:400]   Recent Labs  10/07/12 2140 10/08/12 0539 10/09/12 1802 10/10/12 0440  HGB 9.9* 9.3* 9.2* 8.8*    Recent Labs  10/09/12 1802 10/10/12 0440  WBC 10.9* 11.6*  RBC 3.02* 2.91*  HCT 25.4* 24.4*  PLT 215 230    Recent Labs  10/09/12 0605 10/09/12 1802 10/10/12 0440  NA 130*  --  129*  K 3.8  --  3.9  CL 94*  --  91*  CO2 29  --  30  BUN 7  --  7  CREATININE 0.45* 0.56 0.48*  GLUCOSE 125*  --  116*  CALCIUM 9.2  --  9.1   No results found for this basename: LABPT, INR,  in the last 72 hours  splint/dressing intact.  right grt toe moving and decreased sensation dorsal toe only.  left grt toe with little motion and decreased sensation plantar surface.  cap refill intact.throughout  Assessment/Plan: 1 Day Post-Op Procedure(s) (LRB): OPEN REDUCTION INTERNAL FIXATION (ORIF) Left Tibial Plafon (Left) REMOVAL EXTERNAL FIXATION LEG (Left) Discharge home with home health when stable. Will be non weight bearing on both lower extremities.  Elevation at all times.  Ice packs as needed for pain control.  George Harrison 10/10/2012, 1:48 PM

## 2012-10-10 NOTE — Progress Notes (Signed)
Occupational Therapy Treatment Patient Details Name: George Harrison MRN: [id number] DOB: 1991-03-05 Today's Date: 10/10/2012 Time: 0865-7846 OT Time Calculation (min): 26 min  OT Assessment / Plan / Recommendation Comments on Treatment Session Pt progressing towards goals. Will attempt to practice LB dressing tomorrow.    Follow Up Recommendations  Home health OT;Supervision/Assistance - 24 hour    Barriers to Discharge       Equipment Recommendations  Other (comment);Tub/shower bench (drop arm commode)    Recommendations for Other Services    Frequency Min 2X/week   Plan Discharge plan remains appropriate    Precautions / Restrictions Precautions Precautions: Fall Restrictions Weight Bearing Restrictions: Yes RLE Weight Bearing: Non weight bearing LLE Weight Bearing: Non weight bearing   Pertinent Vitals/Pain 1/10 predominantly in LLE. Using PCA PRN.     ADL  Eating/Feeding: Independent Where Assessed - Eating/Feeding: Chair Lower Body Dressing: Simulated;Moderate assistance Where Assessed - Lower Body Dressing: Supported sitting;Lean right and/or left Toilet Transfer: Simulated;Minimal assistance Toilet Transfer Method: Other (comment) (lateral scoot) Toilet Transfer Equipment: Raised toilet seat with arms (or 3-in-1 over toilet) (drop arm chair) ADL Comments: Performed lateral scoot in bed to drop arm chair. Educated pt on use of reacher to help don shorts over LE and supine position would be easist. Pt unable to reach feet to don shorts while in chair. Will attempt to practice in bed tomorrow.     OT Diagnosis:    OT Problem List:   OT Treatment Interventions:     OT Goals Acute Rehab OT Goals OT Goal Formulation: With patient Time For Goal Achievement: 10/08/12 Potential to Achieve Goals: Good ADL Goals Pt Will Perform Lower Body Bathing: Supine, rolling right and/or left;with min assist Pt Will Perform Lower Body Dressing: Supine, rolling right and/or left;with  min assist ADL Goal: Lower Body Dressing - Progress: Progressing toward goals Pt Will Transfer to Toilet: with min assist;Drop arm 3-in-1 (lateral scoot) ADL Goal: Toilet Transfer - Progress: Progressing toward goals Pt Will Perform Toileting - Clothing Manipulation: with supervision;Other (comment) (leaning side to side) Pt Will Perform Toileting - Hygiene: with supervision;Leaning right and/or left on 3-in-1/toilet  Visit Information  Last OT Received On: 10/10/12 Assistance Needed: +2 PT/OT Co-Evaluation/Treatment: Yes    Subjective Data      Prior Functioning       Cognition  Cognition Arousal/Alertness: Awake/alert Behavior During Therapy: WFL for tasks assessed/performed Overall Cognitive Status: Within Functional Limits for tasks assessed    Mobility  Transfers Transfers:  Min A (lateral scoot) Details for Transfer Assistance: pt required assistance for bil LEs to perform lateral transfer; requires LEs to be elevated to reduce pain and for advancement; pt demo good upper body strength to manuver to drop arm chair     Exercises      Balance     End of Session OT - End of Session Equipment Utilized During Treatment: Other (comment) (2 L O2) Activity Tolerance: Patient tolerated treatment well Patient left: in chair;with call bell/phone within reach;with family/visitor present Nurse Communication: Mobility status  GO     Earlie Raveling OTR/L [phone] 10/10/2012, 3:08 PM

## 2012-10-11 LAB — CBC
MCV: 83.2 fL (ref 78.0–100.0)
Platelets: 286 10*3/uL (ref 150–400)
RBC: 2.91 MIL/uL — ABNORMAL LOW (ref 4.22–5.81)
RDW: 13 % (ref 11.5–15.5)
WBC: 10.6 10*3/uL — ABNORMAL HIGH (ref 4.0–10.5)

## 2012-10-11 LAB — BASIC METABOLIC PANEL
CO2: 30 mEq/L (ref 19–32)
Chloride: 94 mEq/L — ABNORMAL LOW (ref 96–112)
Creatinine, Ser: 0.45 mg/dL — ABNORMAL LOW (ref 0.50–1.35)
GFR calc Af Amer: >90 mL/min (ref >90)
Potassium: 3.8 mEq/L (ref 3.5–5.1)
Sodium: 132 mEq/L — ABNORMAL LOW (ref 135–145)

## 2012-10-11 MED ORDER — DSS 100 MG PO CAPS: 100.0000 mg | ORAL_CAPSULE | Freq: Two times a day (BID) | ORAL | Status: DC

## 2012-10-11 MED ORDER — BISACODYL 10 MG RE SUPP: 10.0000 mg | Freq: Every day | RECTAL | Status: DC | PRN

## 2012-10-11 MED ORDER — OXYCODONE HCL 5 MG PO TABS: 5.0000 mg | ORAL_TABLET | ORAL | Status: AC | PRN

## 2012-10-11 MED ORDER — OXYCODONE HCL 5 MG PO TABS: 5.0000 mg | ORAL_TABLET | ORAL | Status: DC | PRN
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
## 2012-10-11 NOTE — Progress Notes (Signed)
Physical Therapy Treatment Patient Details Name: George Harrison MRN: [id number] DOB: 11-13-1990 Today's Date: 10/11/2012 Time: 1012-1027 PT Time Calculation (min): 15 min  PT Assessment / Plan / Recommendation Comments on Treatment Session  Pt and family/friend education conducted today on safety with w/c and transfers. Pt required assistance of bil LEs to keep them elevated for lateral transfer to w/c. Pt demo good technique. Anticpate D/C with 24/7 care of family today. Pt plans to have family members carry him up to 2nd floor apartment; discussed with pt that this is not the safest decision, pt aware.     Follow Up Recommendations  Home health PT;Supervision/Assistance - 24 hour;Other (comment)     Does the patient have the potential to tolerate intense rehabilitation     Barriers to Discharge        Equipment Recommendations  Wheelchair cushion (measurements PT);Wheelchair (measurements PT);Other (comment)    Recommendations for Other Services    Frequency Min 5X/week   Plan Discharge plan remains appropriate;Frequency remains appropriate    Precautions / Restrictions Precautions Precautions: Fall Restrictions Weight Bearing Restrictions: Yes RLE Weight Bearing: Non weight bearing LLE Weight Bearing: Non weight bearing   Pertinent Vitals/Pain 1/10 with activity; primarily in L LE; pre-medicated.     Mobility  Bed Mobility Bed Mobility: Supine to Sit;Sitting - Scoot to Edge of Bed Supine to Sit: 6: Modified independent (Device/Increase time);HOB flat Sitting - Scoot to Edge of Bed: 6: Modified independent (Device/Increase time) Details for Bed Mobility Assistance: requires vc's for hand placement and sequencing; pt demo good technique required increased time  Transfers Transfers: Lateral/Scoot Transfers Lateral/Scoot Transfers: With armrests removed;4: Min assist Details for Transfer Assistance: pt and girlfriend educated on lateral wheelchair transfers; vc's for hand  placement, sequencing and safety with w/c. Pt required assistance to keep bil LEs elevated constantly  Ambulation/Gait Ambulation/Gait Assistance: Not tested (comment) Wheelchair Mobility Wheelchair Mobility: No    Exercises     PT Diagnosis:    PT Problem List:   PT Treatment Interventions:     PT Goals Acute Rehab PT Goals PT Goal Formulation: With patient Time For Goal Achievement: 10/15/12 Potential to Achieve Goals: Good PT Goal: Supine/Side to Sit - Progress: Met PT Goal: Sit to Supine/Side - Progress: Met PT Transfer Goal: Bed to Chair/Chair to Bed - Progress: Progressing toward goal  Visit Information  Last PT Received On: 10/11/12 Assistance Needed: +1    Subjective Data  Subjective: "I am going home today but i can practice whatever i need to" Patient Stated Goal:  home with family   Cognition  Cognition Arousal/Alertness: Awake/alert Behavior During Therapy: WFL for tasks assessed/performed Overall Cognitive Status: Within Functional Limits for tasks assessed    Balance     End of Session PT - End of Session Activity Tolerance: Patient tolerated treatment well Patient left: in bed;with call bell/phone within reach;with family/visitor present Nurse Communication: Mobility status   GP     Donell Sievert, [place] [phone] 10/11/2012, 11:29 AM

## 2012-10-11 NOTE — Progress Notes (Signed)
Orthopedic Tech Progress Note Patient Details:  George Harrison Aug 04, 1990 [id number] Bilateral CAM walker boots applied to LE Ortho Devices Type of Ortho Device: CAM walker Ortho Device/Splint Location: Bilateral  Ortho Device/Splint Interventions: Application   Asia R Thompson 10/11/2012, 9:48 AM

## 2012-10-11 NOTE — Progress Notes (Signed)
Pt discharged to home accompanied by girlfriend. Discharge instructions and rx given and explained. Pt stated understanding. Pts IV was removed. Pt left unit in a stable condition via wheelchair.

## 2012-10-11 NOTE — Progress Notes (Signed)
Patient ID: George Harrison, male   DOB: 01/27/1991, 22 y.o.   MRN: [id number] Splints removed. CAM boot applied and pin tract care and instructions for patients girlfriend by [facility] RN pin tract care with H2O2 and qtips.   If he is safe in transfers to chair sliding then he can be discharged from my standpoint.   NVI, all incisions look good.   Sensation and motor intact.  Office one week .  Office # [phone]

## 2012-10-14 ENCOUNTER — Encounter (HOSPITAL_COMMUNITY): Payer: Self-pay | Admitting: Orthopaedic Surgery

## 2012-11-08 ENCOUNTER — Emergency Department (HOSPITAL_COMMUNITY)
Admission: EM | Admit: 2012-11-08 | Discharge: 2012-11-08 | Disposition: A | Discharge status: Home / Self Care | Payer: MEDICAID | Attending: Emergency Medicine | Admitting: Emergency Medicine

## 2012-11-08 ENCOUNTER — Encounter (HOSPITAL_COMMUNITY): Payer: Self-pay

## 2012-11-08 DIAGNOSIS — Z789 Other specified health status: Secondary | ICD-10-CM | POA: Insufficient documentation

## 2012-11-08 DIAGNOSIS — Z4889 Encounter for other specified surgical aftercare: Secondary | ICD-10-CM

## 2012-11-08 DIAGNOSIS — Z79899 Other long term (current) drug therapy: Secondary | ICD-10-CM | POA: Insufficient documentation

## 2012-11-08 DIAGNOSIS — Z4802 Encounter for removal of sutures: Secondary | ICD-10-CM | POA: Insufficient documentation

## 2012-11-08 DIAGNOSIS — F17200 Nicotine dependence, unspecified, uncomplicated: Secondary | ICD-10-CM | POA: Insufficient documentation

## 2012-11-08 DIAGNOSIS — R269 Unspecified abnormalities of gait and mobility: Secondary | ICD-10-CM | POA: Insufficient documentation

## 2012-11-08 MED ORDER — HYDROCODONE-ACETAMINOPHEN 5-325 MG PO TABS: ORAL_TABLET | ORAL | Status: DC
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
## 2012-11-08 NOTE — ED Notes (Signed)
[facility] at bedside 

## 2012-11-08 NOTE — ED Notes (Signed)
[facility] contacted for transport 

## 2012-11-08 NOTE — ED Notes (Signed)
Pt being taken home via [facility]. NAD noted upon d/c. Pt leaving with d/c teaching and prescriptions. Pt leaving with wound care dressing applied and education on wound care. Pt alert and mentating appropriately upon d/c.
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
## 2012-11-08 NOTE — ED Notes (Signed)
Josh G, PA at bedside removing sutures and staples bilaterally

## 2012-11-08 NOTE — ED Notes (Signed)
Spoke with Sunny Schlein, [employer], and informed pt that pain medication would be on list at [facility] for discounted price.
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
## 2012-11-08 NOTE — ED Provider Notes (Signed)
Medical screening examination/treatment/procedure(s) were performed by non-physician practitioner and as supervising physician I was immediately available for consultation/collaboration.  Gilda Crease, MD 11/08/12 [phone]

## 2012-12-09 ENCOUNTER — Emergency Department (HOSPITAL_COMMUNITY)
Admission: EM | Admit: 2012-12-09 | Discharge: 2012-12-09 | Disposition: A | Discharge status: Home / Self Care | Payer: MEDICAID | Attending: Emergency Medicine | Admitting: Emergency Medicine

## 2012-12-09 ENCOUNTER — Encounter (HOSPITAL_COMMUNITY): Payer: Self-pay | Admitting: Emergency Medicine

## 2012-12-09 DIAGNOSIS — G8918 Other acute postprocedural pain: Secondary | ICD-10-CM | POA: Insufficient documentation

## 2012-12-09 DIAGNOSIS — F17200 Nicotine dependence, unspecified, uncomplicated: Secondary | ICD-10-CM | POA: Insufficient documentation

## 2012-12-09 MED ORDER — IBUPROFEN 800 MG PO TABS: 800.0000 mg | ORAL_TABLET | Freq: Three times a day (TID) | ORAL | Status: DC

## 2012-12-09 MED ORDER — TRAMADOL HCL 50 MG PO TABS: 50.0000 mg | ORAL_TABLET | Freq: Four times a day (QID) | ORAL | Status: DC | PRN

## 2012-12-09 NOTE — ED Notes (Signed)
Patient states he doesn't really need to be examined, he needs physical therapy.   Patient states wants to be admitted for inpatient physical therapy because he does not have a place to live.
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
## 2012-12-09 NOTE — Progress Notes (Signed)
Received call from Brett Canales on the inpatient rehab unit that he had been given a message that an aunt of the patient had called asking to have the patient admitted to inpatient rehab. He called me as CM of the trauma service to follow up with patient.  Called both home and mobile numbers for pt listed in EPIC.  Neither is a working number any longer. Called the phone given by his girlfriend at discharge, [phone] and that also is not a working number.  Called the number listed for pt's aunt, Durene Fruits.  No answer and no ability to leave a voice mail, but she returned the call within 2 minutes.  She stated it was not her that had called but she was able to get a message to the patient.  Advised her that the patient should follow up with Dr. Ophelia Charter since he is still the primary physician for the patient. Aunt stated understanding and said she would give pt message.

## 2012-12-09 NOTE — ED Notes (Addendum)
Pt reports he is homeless and needs PT for ankle because it keeps swelling and going down; was discharged from here and insists he needs PT but no one said anything to him about it. He is hoping to get into rehab facility for that since he has no where to go.

## 2012-12-10 NOTE — ED Provider Notes (Signed)
Medical screening examination/treatment/procedure(s) were performed by non-physician practitioner and as supervising physician I was immediately available for consultation/collaboration.  Brayley Mackowiak R. Loralye Loberg, MD 12/10/12 0017 

## 2012-12-11 ENCOUNTER — Encounter (HOSPITAL_COMMUNITY): Payer: Self-pay | Admitting: Emergency Medicine

## 2012-12-11 DIAGNOSIS — Z9181 History of falling: Secondary | ICD-10-CM | POA: Insufficient documentation

## 2012-12-11 DIAGNOSIS — Z87828 Personal history of other (healed) physical injury and trauma: Secondary | ICD-10-CM | POA: Insufficient documentation

## 2012-12-11 DIAGNOSIS — F17200 Nicotine dependence, unspecified, uncomplicated: Secondary | ICD-10-CM | POA: Insufficient documentation

## 2012-12-11 DIAGNOSIS — Z5900 Homelessness unspecified: Secondary | ICD-10-CM | POA: Insufficient documentation

## 2012-12-11 DIAGNOSIS — Z8781 Personal history of (healed) traumatic fracture: Secondary | ICD-10-CM | POA: Insufficient documentation

## 2012-12-11 DIAGNOSIS — G8929 Other chronic pain: Secondary | ICD-10-CM | POA: Insufficient documentation

## 2012-12-11 NOTE — ED Notes (Signed)
PT. REPORTS BILATERAL ANKLE PAIN WITH SWELLING FOR SEVERAL DAYS DENIES RECENT INJURY OR FALL , PT. STATED HISTORY OF BILATERAL ANKLE SURGERY LAST 09/2012 . PT. IS HOMELESS.

## 2012-12-12 ENCOUNTER — Emergency Department (HOSPITAL_COMMUNITY): Payer: MEDICAID

## 2012-12-12 ENCOUNTER — Emergency Department (HOSPITAL_COMMUNITY)
Admission: EM | Admit: 2012-12-12 | Discharge: 2012-12-12 | Disposition: A | Discharge status: Home / Self Care | Payer: MEDICAID | Attending: Emergency Medicine | Admitting: Emergency Medicine

## 2012-12-12 DIAGNOSIS — G8929 Other chronic pain: Secondary | ICD-10-CM

## 2012-12-12 HISTORY — DX: Homelessness unspecified: Z59.00

## 2012-12-12 HISTORY — DX: Homelessness: Z59.0

## 2012-12-12 LAB — CBC WITH DIFFERENTIAL/PLATELET
Basophils Absolute: 0 10*3/uL (ref 0.0–0.1)
Basophils Relative: 0 % (ref 0–1)
Eosinophils Absolute: 0.2 10*3/uL (ref 0.0–0.7)
MCH: 29.6 pg (ref 26.0–34.0)
MCHC: 34.4 g/dL (ref 30.0–36.0)
Monocytes Relative: 6 % (ref 3–12)
Neutro Abs: 2.9 10*3/uL (ref 1.7–7.7)
Neutrophils Relative %: 41 % — ABNORMAL LOW (ref 43–77)
Platelets: 182 10*3/uL (ref 150–400)
RDW: 12.7 % (ref 11.5–15.5)

## 2012-12-12 MED ORDER — HYDROCODONE-ACETAMINOPHEN 5-325 MG PO TABS
1.0000 | ORAL_TABLET | Freq: Once | ORAL | Status: AC
  Administered 2012-12-12: 1 via ORAL
  Filled 2012-12-12: qty 1
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
## 2012-12-12 NOTE — ED Notes (Signed)
Pt comfortable with d/c and f/u instructions. 

## 2012-12-12 NOTE — ED Provider Notes (Signed)
Medical screening examination/treatment/procedure(s) were performed by non-physician practitioner and as supervising physician I was immediately available for consultation/collaboration.  Olivia Mackie, MD 12/12/12 [phone]

## 2012-12-12 NOTE — ED Notes (Signed)
Pt returned from xray

## 2015-09-27 ENCOUNTER — Emergency Department (HOSPITAL_COMMUNITY)
Admission: EM | Admit: 2015-09-27 | Discharge: 2015-09-27 | Disposition: A | Discharge status: Home / Self Care | Payer: Self-pay

## 2015-10-12 ENCOUNTER — Encounter (HOSPITAL_COMMUNITY): Payer: Self-pay

## 2015-10-12 ENCOUNTER — Emergency Department (HOSPITAL_COMMUNITY)
Admission: EM | Admit: 2015-10-12 | Discharge: 2015-10-12 | Disposition: A | Discharge status: Home / Self Care | Payer: Self-pay | Attending: Emergency Medicine | Admitting: Emergency Medicine

## 2015-10-12 ENCOUNTER — Emergency Department (HOSPITAL_COMMUNITY): Payer: Self-pay

## 2015-10-12 DIAGNOSIS — G8929 Other chronic pain: Secondary | ICD-10-CM | POA: Insufficient documentation

## 2015-10-12 DIAGNOSIS — Z590 Homelessness: Secondary | ICD-10-CM | POA: Insufficient documentation

## 2015-10-12 DIAGNOSIS — R0989 Other specified symptoms and signs involving the circulatory and respiratory systems: Secondary | ICD-10-CM | POA: Insufficient documentation

## 2015-10-12 DIAGNOSIS — R05 Cough: Secondary | ICD-10-CM | POA: Insufficient documentation

## 2015-10-12 DIAGNOSIS — Z87828 Personal history of other (healed) physical injury and trauma: Secondary | ICD-10-CM | POA: Insufficient documentation

## 2015-10-12 DIAGNOSIS — F17210 Nicotine dependence, cigarettes, uncomplicated: Secondary | ICD-10-CM | POA: Insufficient documentation

## 2015-10-12 DIAGNOSIS — R200 Anesthesia of skin: Secondary | ICD-10-CM | POA: Insufficient documentation

## 2015-10-12 DIAGNOSIS — R0981 Nasal congestion: Secondary | ICD-10-CM | POA: Insufficient documentation

## 2015-10-12 DIAGNOSIS — R197 Diarrhea, unspecified: Secondary | ICD-10-CM | POA: Insufficient documentation

## 2015-10-12 DIAGNOSIS — R112 Nausea with vomiting, unspecified: Secondary | ICD-10-CM | POA: Insufficient documentation

## 2015-10-12 DIAGNOSIS — M791 Myalgia: Secondary | ICD-10-CM | POA: Insufficient documentation

## 2015-10-12 DIAGNOSIS — M2550 Pain in unspecified joint: Secondary | ICD-10-CM | POA: Insufficient documentation

## 2015-10-12 DIAGNOSIS — R059 Cough, unspecified: Secondary | ICD-10-CM

## 2015-10-12 DIAGNOSIS — R1033 Periumbilical pain: Secondary | ICD-10-CM | POA: Insufficient documentation

## 2015-10-12 LAB — COMPREHENSIVE METABOLIC PANEL
ALBUMIN: 3.8 g/dL (ref 3.5–5.0)
ALT: 20 U/L (ref 17–63)
ANION GAP: 8 (ref 5–15)
AST: 25 U/L (ref 15–41)
Alkaline Phosphatase: 48 U/L (ref 38–126)
BUN: 6 mg/dL (ref 6–20)
CHLORIDE: 105 mmol/L (ref 101–111)
CO2: 29 mmol/L (ref 22–32)
Calcium: 9.4 mg/dL (ref 8.9–10.3)
Creatinine, Ser: 0.67 mg/dL (ref 0.61–1.24)
GFR calc Af Amer: >60 mL/min (ref >60)
GFR calc non Af Amer: >60 mL/min (ref >60)
GLUCOSE: 89 mg/dL (ref 65–99)
POTASSIUM: 4.2 mmol/L (ref 3.5–5.1)
SODIUM: 142 mmol/L (ref 135–145)
TOTAL PROTEIN: 7 g/dL (ref 6.5–8.1)
Total Bilirubin: 0.2 mg/dL — ABNORMAL LOW (ref 0.3–1.2)

## 2015-10-12 LAB — CBC
HEMATOCRIT: 44 % (ref 39.0–52.0)
HEMOGLOBIN: 14.8 g/dL (ref 13.0–17.0)
MCH: 29.8 pg (ref 26.0–34.0)
MCHC: 33.6 g/dL (ref 30.0–36.0)
MCV: 88.5 fL (ref 78.0–100.0)
Platelets: 268 10*3/uL (ref 150–400)
RBC: 4.97 MIL/uL (ref 4.22–5.81)
RDW: 13.1 % (ref 11.5–15.5)
WBC: 5.9 10*3/uL (ref 4.0–10.5)

## 2015-10-12 LAB — URINALYSIS, ROUTINE W REFLEX MICROSCOPIC
Bilirubin Urine: NEGATIVE
Glucose, UA: NEGATIVE mg/dL
Hgb urine dipstick: NEGATIVE
Ketones, ur: NEGATIVE mg/dL
Leukocytes, UA: NEGATIVE
NITRITE: NEGATIVE
Protein, ur: NEGATIVE mg/dL
SPECIFIC GRAVITY, URINE: 1.019 (ref 1.005–1.030)
pH: 8.5 — ABNORMAL HIGH (ref 5.0–8.0)

## 2015-10-12 LAB — LIPASE, BLOOD: Lipase: 25 U/L (ref 11–51)

## 2015-10-12 MED ORDER — BENZONATATE 100 MG PO CAPS: 100.0000 mg | ORAL_CAPSULE | Freq: Three times a day (TID) | ORAL | Status: DC | PRN

## 2015-10-12 MED ORDER — ONDANSETRON HCL 4 MG/2ML IJ SOLN
4.0000 mg | Freq: Once | INTRAMUSCULAR | Status: AC
  Administered 2015-10-12: 4 mg via INTRAVENOUS
  Filled 2015-10-12: qty 2

## 2015-10-12 MED ORDER — IOPAMIDOL (ISOVUE-300) INJECTION 61%
INTRAVENOUS | Status: AC
  Administered 2015-10-12: 100 mL
  Filled 2015-10-12: qty 100

## 2015-10-12 MED ORDER — SODIUM CHLORIDE 0.9 % IV BOLUS (SEPSIS)
1000.0000 mL | Freq: Once | INTRAVENOUS | Status: AC
  Administered 2015-10-12: 1000 mL via INTRAVENOUS

## 2015-10-12 MED ORDER — ONDANSETRON HCL 4 MG PO TABS: 4.0000 mg | ORAL_TABLET | Freq: Four times a day (QID) | ORAL | Status: DC

## 2015-10-12 MED ORDER — NAPROXEN 375 MG PO TABS: 375.0000 mg | ORAL_TABLET | Freq: Two times a day (BID) | ORAL | Status: DC
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
## 2015-10-12 NOTE — ED Notes (Signed)
Patient transported to CT 

## 2015-10-12 NOTE — Discharge Instructions (Signed)
Medications: Naprosyn, Zofran, Tessalon  Treatment: Take Zofran every 6 hours as needed for nausea and vomiting. Take Tessalon every 8 hours as needed for cough. Take Naprosyn twice daily (every 12 hours) for your pain. Quitting smoking is recommended for your overall health and improvement of your cough. Drink plenty of fluids; 8 glasses of water per day is recommended. Get plenty of rest.  Follow-up: Please follow-up with the [facility] listed on your discharge paperwork to establish care and for further evaluation and treatment treatment of your chronic leg and ankle pain. Please return to the emergency department if he develop any new or worsening symptoms.   Abdominal Pain, Adult Many things can cause abdominal pain. Usually, abdominal pain is not caused by a disease and will improve without treatment. It can often be observed and treated at home. Your health care provider will do a physical exam and possibly order blood tests and X-rays to help determine the seriousness of your pain. However, in many cases, more time must pass before a clear cause of the pain can be found. Before that point, your health care provider may not know if you need more testing or further treatment. HOME CARE INSTRUCTIONS Monitor your abdominal pain for any changes. The following actions may help to alleviate any discomfort you are experiencing:  Only take over-the-counter or prescription medicines as directed by your health care provider.  Do not take laxatives unless directed to do so by your health care provider.  Try a clear liquid diet (broth, tea, or water) as directed by your health care provider. Slowly move to a bland diet as tolerated. SEEK MEDICAL CARE IF:  You have unexplained abdominal pain.  You have abdominal pain associated with nausea or diarrhea.  You have pain when you urinate or have a bowel movement.  You experience abdominal pain that wakes you in the night.  You have abdominal  pain that is worsened or improved by eating food.  You have abdominal pain that is worsened with eating fatty foods.  You have a fever. SEEK IMMEDIATE MEDICAL CARE IF:  Your pain does not go away within 2 hours.  You keep throwing up (vomiting).  Your pain is felt only in portions of the abdomen, such as the right side or the left lower portion of the abdomen.  You pass bloody or black tarry stools. MAKE SURE YOU:  Understand these instructions.  Will watch your condition.  Will get help right away if you are not doing well or get worse.   This information is not intended to replace advice given to you by your health care provider. Make sure you discuss any questions you have with your health care provider.   Document Released: 03/08/2005 Document Revised: 02/17/2015 Document Reviewed: 02/05/2013 Elsevier Interactive Patient Education 2016 Elsevier Inc.   Nausea and Vomiting Nausea is a sick feeling that often comes before throwing up (vomiting). Vomiting is a reflex where stomach contents come out of your mouth. Vomiting can cause severe loss of body fluids (dehydration). Children and elderly adults can become dehydrated quickly, especially if they also have diarrhea. Nausea and vomiting are symptoms of a condition or disease. It is important to find the cause of your symptoms. CAUSES   Direct irritation of the stomach lining. This irritation can result from increased acid production (gastroesophageal reflux disease), infection, food poisoning, taking certain medicines (such as nonsteroidal anti-inflammatory drugs), alcohol use, or tobacco use.  Signals from the brain.These signals could be caused by  a headache, heat exposure, an inner ear disturbance, increased pressure in the brain from injury, infection, a tumor, or a concussion, pain, emotional stimulus, or metabolic problems.  An obstruction in the gastrointestinal tract (bowel obstruction).  Illnesses such as diabetes,  hepatitis, gallbladder problems, appendicitis, kidney problems, cancer, sepsis, atypical symptoms of a heart attack, or eating disorders.  Medical treatments such as chemotherapy and radiation.  Receiving medicine that makes you sleep (general anesthetic) during surgery. DIAGNOSIS Your caregiver may ask for tests to be done if the problems do not improve after a few days. Tests may also be done if symptoms are severe or if the reason for the nausea and vomiting is not clear. Tests may include:  Urine tests.  Blood tests.  Stool tests.  Cultures (to look for evidence of infection).  X-rays or other imaging studies. Test results can help your caregiver make decisions about treatment or the need for additional tests. TREATMENT You need to stay well hydrated. Drink frequently but in small amounts.You may wish to drink water, sports drinks, clear broth, or eat frozen ice pops or gelatin dessert to help stay hydrated.When you eat, eating slowly may help prevent nausea.There are also some antinausea medicines that may help prevent nausea. HOME CARE INSTRUCTIONS   Take all medicine as directed by your caregiver.  If you do not have an appetite, do not force yourself to eat. However, you must continue to drink fluids.  If you have an appetite, eat a normal diet unless your caregiver tells you differently.  Eat a variety of complex carbohydrates (rice, wheat, potatoes, bread), lean meats, yogurt, fruits, and vegetables.  Avoid high-fat foods because they are more difficult to digest.  Drink enough water and fluids to keep your urine clear or pale yellow.  If you are dehydrated, ask your caregiver for specific rehydration instructions. Signs of dehydration may include:  Severe thirst.  Dry lips and mouth.  Dizziness.  Dark urine.  Decreasing urine frequency and amount.  Confusion.  Rapid breathing or pulse. SEEK IMMEDIATE MEDICAL CARE IF:   You have blood or brown flecks  (like coffee grounds) in your vomit.  You have black or bloody stools.  You have a severe headache or stiff neck.  You are confused.  You have severe abdominal pain.  You have chest pain or trouble breathing.  You do not urinate at least once every 8 hours.  You develop cold or clammy skin.  You continue to vomit for longer than 24 to 48 hours.  You have a fever. MAKE SURE YOU:   Understand these instructions.  Will watch your condition.  Will get help right away if you are not doing well or get worse.   This information is not intended to replace advice given to you by your health care provider. Make sure you discuss any questions you have with your health care provider.   Document Released: 05/29/2005 Document Revised: 08/21/2011 Document Reviewed: 10/26/2010 Elsevier Interactive Patient Education 2016 Elsevier Inc.   Chronic Pain Chronic pain can be defined as pain that is off and on and lasts for 3-6 months or longer. Many things cause chronic pain, which can make it difficult to make a diagnosis. There are many treatment options available for chronic pain. However, finding a treatment that works well for you may require trying various approaches until the right one is found. Many people benefit from a combination of two or more types of treatment to control their pain. SYMPTOMS  Chronic  pain can occur anywhere in the body and can range from mild to very severe. Some types of chronic pain include:  Headache.  Low back pain.  Cancer pain.  Arthritis pain.  Neurogenic pain. This is pain resulting from damage to nerves. People with chronic pain may also have other symptoms such as:  Depression.  Anger.  Insomnia.  Anxiety. DIAGNOSIS  Your health care provider will help diagnose your condition over time. In many cases, the initial focus will be on excluding possible conditions that could be causing the pain. Depending on your symptoms, your health care  provider may order tests to diagnose your condition. Some of these tests may include:   Blood tests.   CT scan.   MRI.   X-rays.   Ultrasounds.   Nerve conduction studies.  You may need to see a specialist.  TREATMENT  Finding treatment that works well may take time. You may be referred to a pain specialist. He or she may prescribe medicine or therapies, such as:   Mindful meditation or yoga.  Shots (injections) of numbing or pain-relieving medicines into the spine or area of pain.  Local electrical stimulation.  Acupuncture.   Massage therapy.   Aroma, color, light, or sound therapy.   Biofeedback.   Working with a physical therapist to keep from getting stiff.   Regular, gentle exercise.   Cognitive or behavioral therapy.   Group support.  Sometimes, surgery may be recommended.  HOME CARE INSTRUCTIONS   Take all medicines as directed by your health care provider.   Lessen stress in your life by relaxing and doing things such as listening to calming music.   Exercise or be active as directed by your health care provider.   Eat a healthy diet and include things such as vegetables, fruits, fish, and lean meats in your diet.   Keep all follow-up appointments with your health care provider.   Attend a support group with others suffering from chronic pain. SEEK MEDICAL CARE IF:   Your pain gets worse.   You develop a new pain that was not there before.   You cannot tolerate medicines given to you by your health care provider.   You have new symptoms since your last visit with your health care provider.  SEEK IMMEDIATE MEDICAL CARE IF:   You feel weak.   You have decreased sensation or numbness.   You lose control of bowel or bladder function.   Your pain suddenly gets much worse.   You develop shaking.  You develop chills.  You develop confusion.  You develop chest pain.  You develop shortness of breath.  MAKE  SURE YOU:  Understand these instructions.  Will watch your condition.  Will get help right away if you are not doing well or get worse.   This information is not intended to replace advice given to you by your health care provider. Make sure you discuss any questions you have with your health care provider.   Document Released: 02/18/2002 Document Revised: 01/29/2013 Document Reviewed: 11/22/2012 Elsevier Interactive Patient Education [employer]2016 Elsevier Inc.

## 2015-10-12 NOTE — ED Notes (Signed)
Patient here by EMS for lower abdominal pain with cramping since last pm. Vomiting x 1 and diarrhea last pm

## 2016-03-15 ENCOUNTER — Encounter (HOSPITAL_COMMUNITY): Payer: Self-pay

## 2016-03-15 ENCOUNTER — Emergency Department (HOSPITAL_COMMUNITY)
Admission: EM | Admit: 2016-03-15 | Discharge: 2016-03-16 | Disposition: A | Discharge status: Home / Self Care | Payer: Self-pay | Attending: Emergency Medicine | Admitting: Emergency Medicine

## 2016-03-15 ENCOUNTER — Emergency Department (HOSPITAL_COMMUNITY): Payer: Self-pay

## 2016-03-15 DIAGNOSIS — Y92002 Bathroom of unspecified non-institutional (private) residence single-family (private) house as the place of occurrence of the external cause: Secondary | ICD-10-CM | POA: Insufficient documentation

## 2016-03-15 DIAGNOSIS — F17210 Nicotine dependence, cigarettes, uncomplicated: Secondary | ICD-10-CM | POA: Insufficient documentation

## 2016-03-15 DIAGNOSIS — Y999 Unspecified external cause status: Secondary | ICD-10-CM | POA: Insufficient documentation

## 2016-03-15 DIAGNOSIS — W010XXA Fall on same level from slipping, tripping and stumbling without subsequent striking against object, initial encounter: Secondary | ICD-10-CM | POA: Insufficient documentation

## 2016-03-15 DIAGNOSIS — Z79899 Other long term (current) drug therapy: Secondary | ICD-10-CM | POA: Insufficient documentation

## 2016-03-15 DIAGNOSIS — M25572 Pain in left ankle and joints of left foot: Secondary | ICD-10-CM | POA: Insufficient documentation

## 2016-03-15 DIAGNOSIS — Y939 Activity, unspecified: Secondary | ICD-10-CM | POA: Insufficient documentation
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
## 2016-03-15 NOTE — ED Notes (Signed)
Pt transported to XR.  

## 2016-03-16 MED ORDER — NAPROXEN 500 MG PO TABS: 500.0000 mg | ORAL_TABLET | Freq: Two times a day (BID) | ORAL | 0 refills | Status: DC

## 2016-03-16 NOTE — ED Notes (Signed)
Pt given phone to call ride 

## 2016-03-16 NOTE — ED Notes (Addendum)
ASO brace applied to pt L foot. Crutches and education given.
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
## 2016-03-16 NOTE — ED Notes (Signed)
Pt requesting to speak to social work in the morning. CN made aware.

## 2016-03-16 NOTE — Discharge Instructions (Signed)
No bony injuries based on the x-rays. Use the crutches as needed. When you can tolerate weightbearing on the foot is okay to walk on it. Use the ASO wrap to stabilize the ankle. If not improving in not 5-7 days make the follow-up with orthopedics. Take the Naprosyn as directed.

## 2017-10-06 IMAGING — CR DG CHEST 2V
2 series · 2 of 2 positions shown · non-contrast
Comparison: 08/29/2011.  Chest CT 10/02/2012.

CLINICAL DATA: 25-year-old male with productive cough sore throat
and chest soreness for 4 weeks. Initial encounter.

EXAM:
CHEST  2 VIEW

[chest pa]
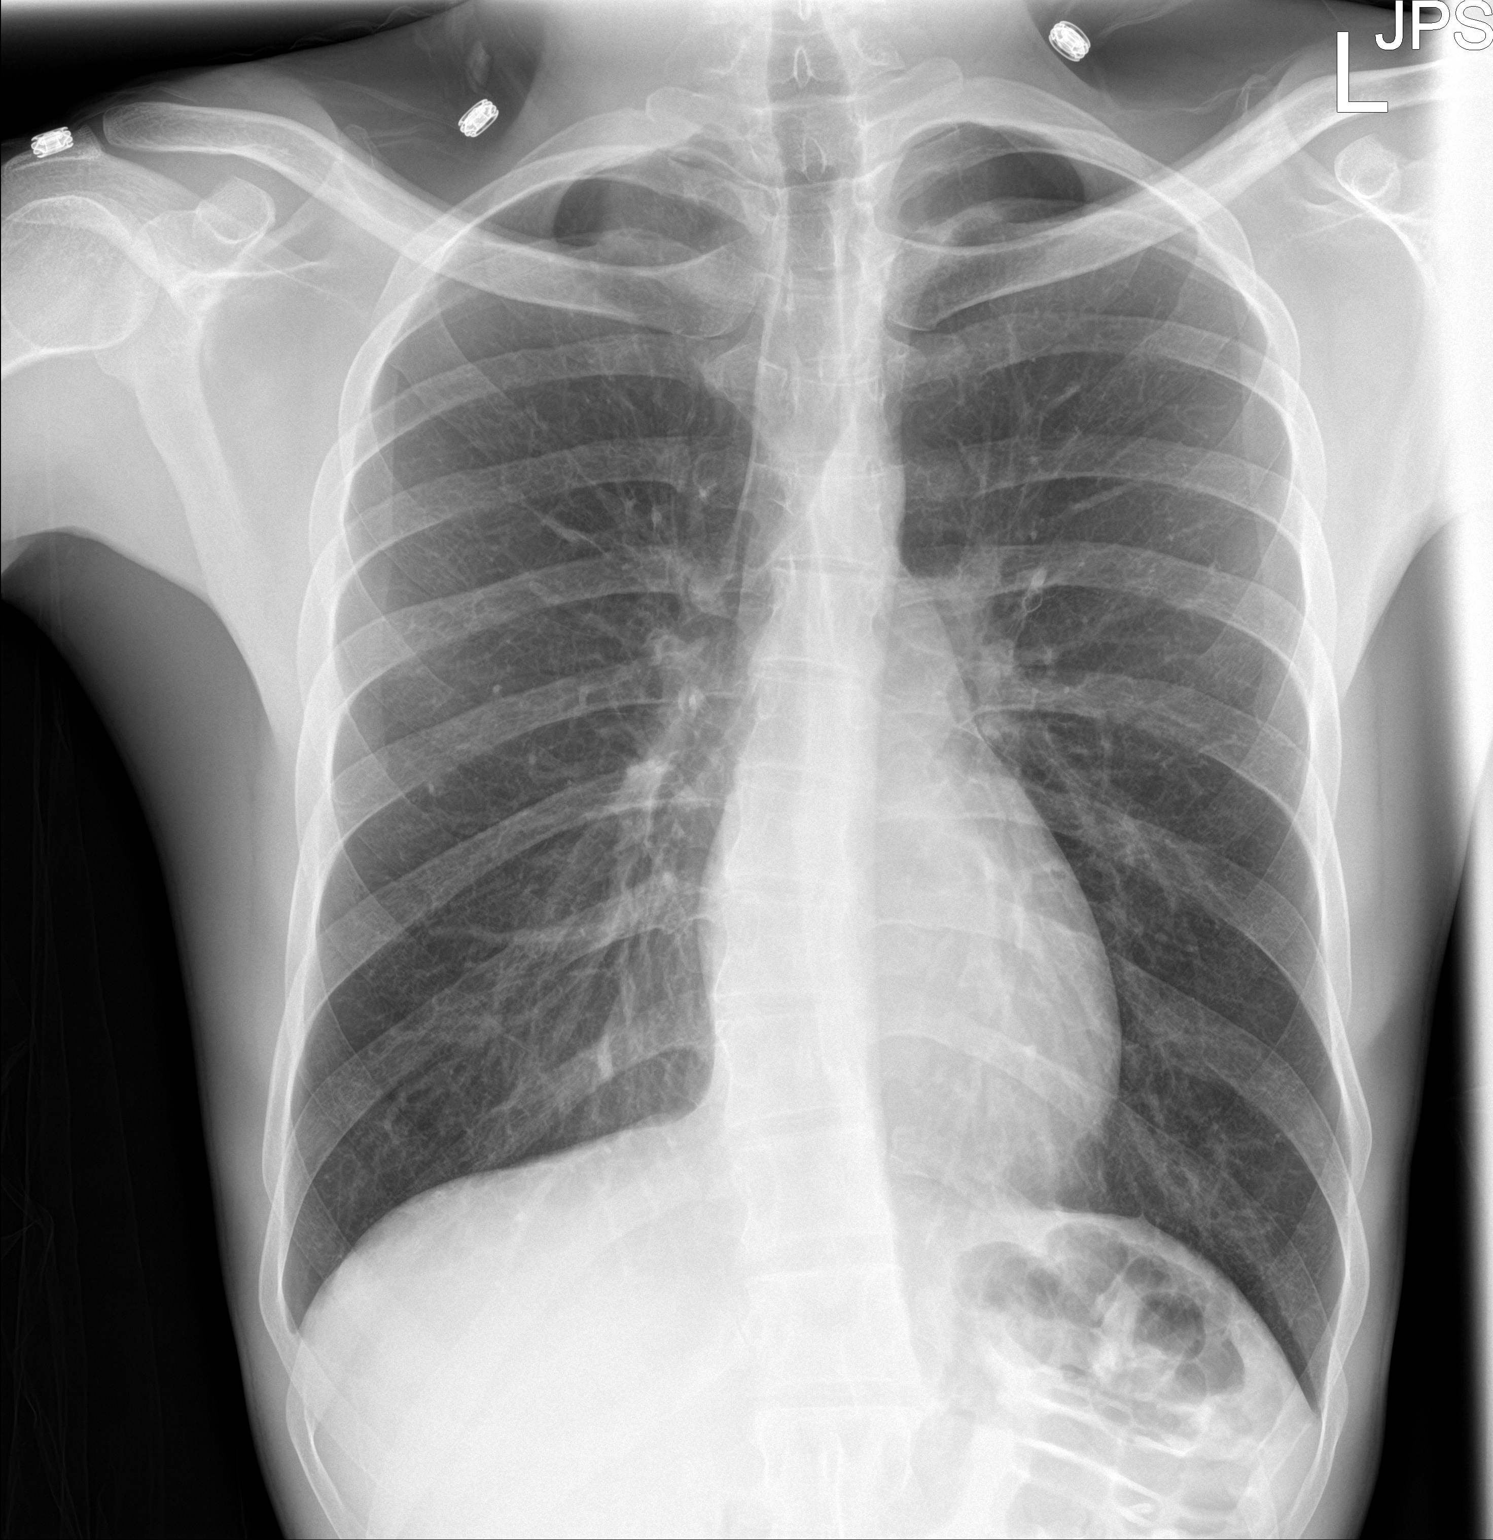

[chest lat]
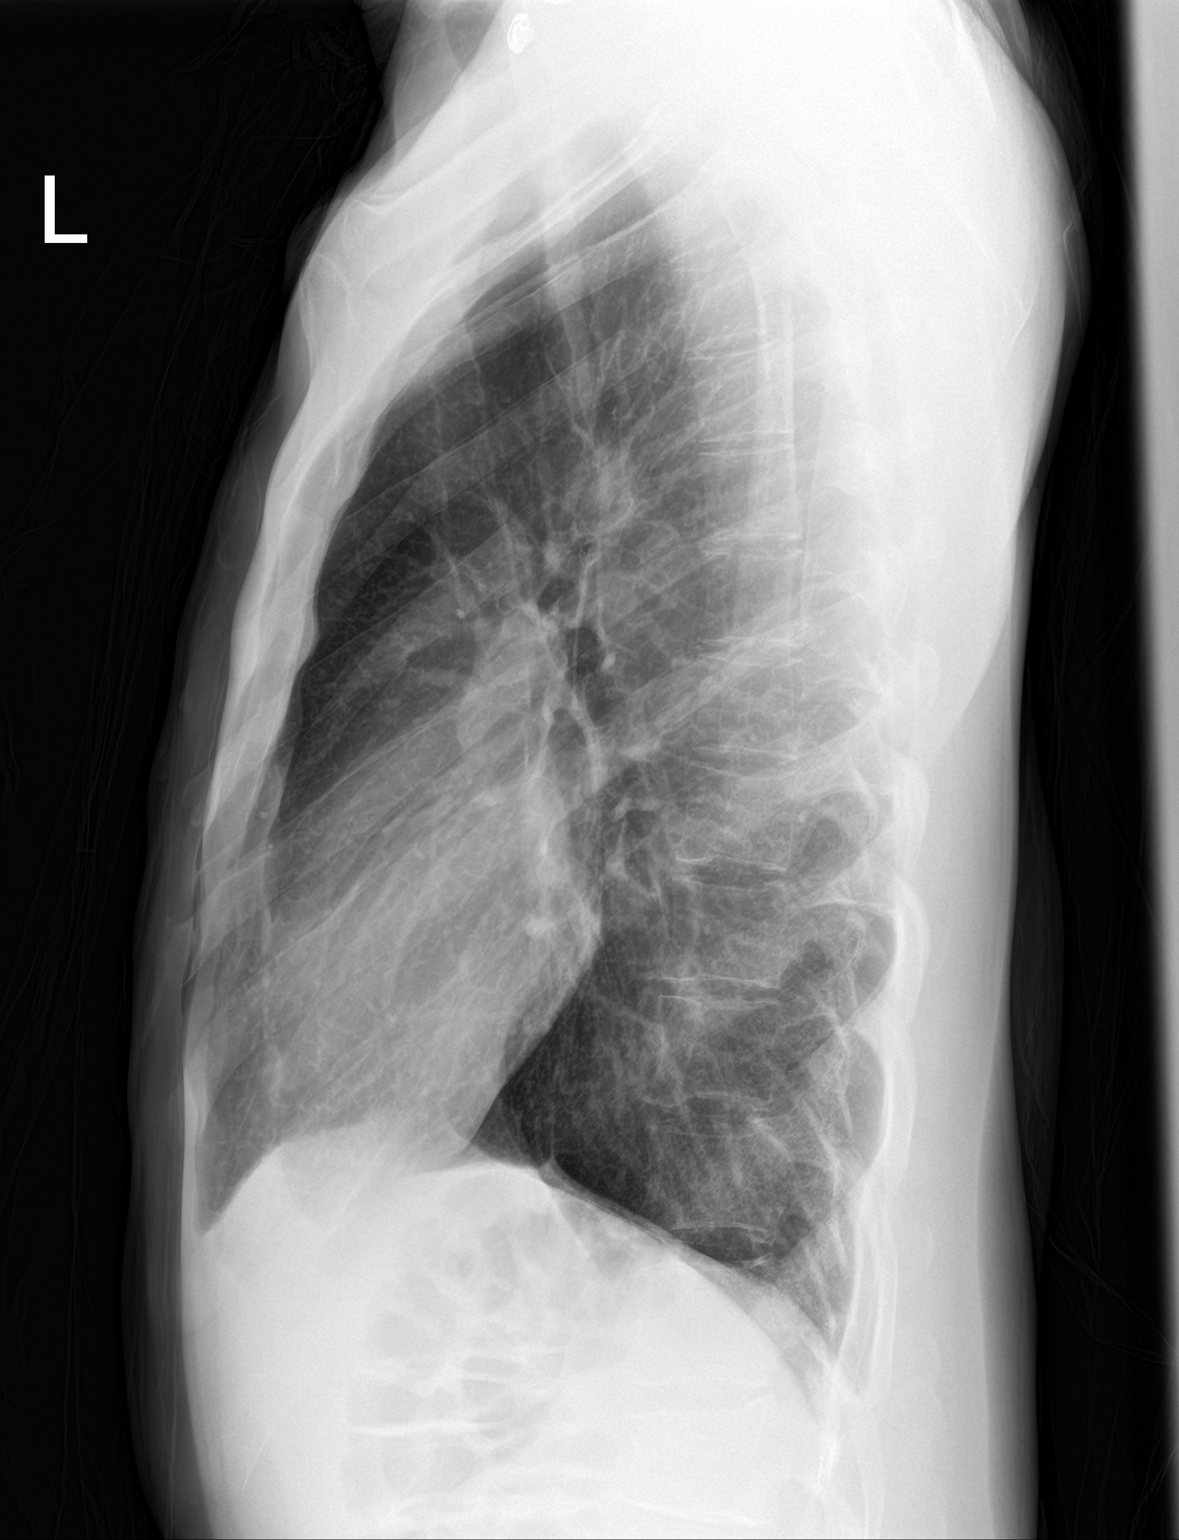

[2 of 2 positions shown; findings below may reference images not displayed]

FINDINGS: Thoracic scoliosis re- demonstrated. Lung volumes are stable at the
upper limits of normal. Normal cardiac size and mediastinal
contours. Visualized tracheal air column is within normal limits.
The lungs are clear. No pneumothorax or pleural effusion.
IMPRESSION: No acute cardiopulmonary abnormality.

## 2019-01-22 ENCOUNTER — Emergency Department (HOSPITAL_COMMUNITY): Payer: Self-pay

## 2019-01-22 ENCOUNTER — Encounter (HOSPITAL_COMMUNITY): Payer: Self-pay | Admitting: Emergency Medicine

## 2019-01-22 ENCOUNTER — Emergency Department (HOSPITAL_COMMUNITY)
Admission: EM | Admit: 2019-01-22 | Discharge: 2019-01-22 | Disposition: A | Discharge status: Home / Self Care | Payer: Self-pay | Attending: Emergency Medicine | Admitting: Emergency Medicine

## 2019-01-22 DIAGNOSIS — Y9389 Activity, other specified: Secondary | ICD-10-CM | POA: Insufficient documentation

## 2019-01-22 DIAGNOSIS — Z79899 Other long term (current) drug therapy: Secondary | ICD-10-CM | POA: Insufficient documentation

## 2019-01-22 DIAGNOSIS — F17210 Nicotine dependence, cigarettes, uncomplicated: Secondary | ICD-10-CM | POA: Insufficient documentation

## 2019-01-22 DIAGNOSIS — Y999 Unspecified external cause status: Secondary | ICD-10-CM | POA: Insufficient documentation

## 2019-01-22 DIAGNOSIS — Y929 Unspecified place or not applicable: Secondary | ICD-10-CM | POA: Insufficient documentation

## 2019-01-22 DIAGNOSIS — S8264XA Nondisplaced fracture of lateral malleolus of right fibula, initial encounter for closed fracture: Secondary | ICD-10-CM | POA: Insufficient documentation

## 2019-01-22 MED ORDER — OXYCODONE HCL 5 MG PO TABS
5.0000 mg | ORAL_TABLET | Freq: Once | ORAL | Status: AC
  Administered 2019-01-22: 21:00:00 5 mg via ORAL
  Filled 2019-01-22: qty 1

## 2019-01-22 MED ORDER — ACETAMINOPHEN 500 MG PO TABS
1000.0000 mg | ORAL_TABLET | Freq: Once | ORAL | Status: AC
  Administered 2019-01-22: 1000 mg via ORAL
  Filled 2019-01-22: qty 2

## 2019-01-22 MED ORDER — HYDROCODONE-ACETAMINOPHEN 5-325 MG PO TABS: 1.0000 | ORAL_TABLET | Freq: Four times a day (QID) | ORAL | 0 refills | Status: DC | PRN

## 2019-01-22 MED ORDER — IBUPROFEN 800 MG PO TABS: 800.0000 mg | ORAL_TABLET | Freq: Three times a day (TID) | ORAL | 0 refills | Status: DC | PRN
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
## 2019-01-22 NOTE — Discharge Instructions (Addendum)
It was my pleasure taking care of you today!  Ibuprofen as needed for mild to moderate pain.  Narcotic pain medication only as needed for severe pain.   Ice helps with pain / swelling.   Call the orthopedist listed in the morning to schedule a follow up appointment.   Return to ER for new or worsening symptoms, any additional concerns.
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
## 2019-02-09 ENCOUNTER — Encounter (HOSPITAL_COMMUNITY): Payer: Self-pay | Admitting: Emergency Medicine

## 2019-02-09 ENCOUNTER — Emergency Department (HOSPITAL_COMMUNITY)
Admission: EM | Admit: 2019-02-09 | Discharge: 2019-02-09 | Disposition: A | Discharge status: Home / Self Care | Payer: Self-pay | Attending: Emergency Medicine | Admitting: Emergency Medicine

## 2019-02-09 ENCOUNTER — Other Ambulatory Visit: Payer: Self-pay

## 2019-02-09 DIAGNOSIS — S8264XD Nondisplaced fracture of lateral malleolus of right fibula, subsequent encounter for closed fracture with routine healing: Secondary | ICD-10-CM | POA: Insufficient documentation

## 2019-02-09 DIAGNOSIS — Z79899 Other long term (current) drug therapy: Secondary | ICD-10-CM | POA: Insufficient documentation

## 2019-02-09 DIAGNOSIS — F17210 Nicotine dependence, cigarettes, uncomplicated: Secondary | ICD-10-CM | POA: Insufficient documentation

## 2019-02-09 DIAGNOSIS — Z7689 Persons encountering health services in other specified circumstances: Secondary | ICD-10-CM

## 2019-02-09 DIAGNOSIS — Z0279 Encounter for issue of other medical certificate: Secondary | ICD-10-CM | POA: Insufficient documentation
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
## 2019-02-09 NOTE — ED Notes (Signed)
Ace wrap applied

## 2019-02-09 NOTE — Discharge Instructions (Signed)
Wear the ace wrap on your ankle as we discussed. You should ice and elevate the ankle if you notice swelling. Take ibuprofen for pain as needed
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
## 2019-04-09 ENCOUNTER — Encounter (HOSPITAL_COMMUNITY): Payer: Self-pay

## 2019-04-09 ENCOUNTER — Other Ambulatory Visit: Payer: Self-pay

## 2019-04-09 ENCOUNTER — Ambulatory Visit (HOSPITAL_COMMUNITY)
Admission: EM | Admit: 2019-04-09 | Discharge: 2019-04-09 | Disposition: A | Discharge status: Home / Self Care | Payer: Self-pay | Attending: Urgent Care | Admitting: Urgent Care

## 2019-04-09 DIAGNOSIS — H109 Unspecified conjunctivitis: Secondary | ICD-10-CM

## 2019-04-09 DIAGNOSIS — H5712 Ocular pain, left eye: Secondary | ICD-10-CM

## 2019-04-09 MED ORDER — NAPROXEN 500 MG PO TABS: 500.0000 mg | ORAL_TABLET | Freq: Two times a day (BID) | ORAL | 0 refills | Status: DC

## 2019-04-09 MED ORDER — ERYTHROMYCIN 5 MG/GM OP OINT: TOPICAL_OINTMENT | OPHTHALMIC | 0 refills | Status: DC

## 2019-04-09 NOTE — ED Triage Notes (Signed)
Pt states he has pain and drainage in his left. Pt states he has a knot in the center of his left eye. Pt states it's very painful.pt states the light makes it hurt worst.
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
## 2020-10-04 ENCOUNTER — Emergency Department (HOSPITAL_BASED_OUTPATIENT_CLINIC_OR_DEPARTMENT_OTHER)
Admission: EM | Admit: 2020-10-04 | Discharge: 2020-10-05 | Disposition: A | Discharge status: Home / Self Care | Payer: BC Managed Care – PPO | Attending: Emergency Medicine | Admitting: Emergency Medicine

## 2020-10-04 ENCOUNTER — Encounter (HOSPITAL_BASED_OUTPATIENT_CLINIC_OR_DEPARTMENT_OTHER): Payer: Self-pay | Admitting: Emergency Medicine

## 2020-10-04 ENCOUNTER — Other Ambulatory Visit: Payer: Self-pay

## 2020-10-04 DIAGNOSIS — R112 Nausea with vomiting, unspecified: Secondary | ICD-10-CM | POA: Insufficient documentation

## 2020-10-04 DIAGNOSIS — U071 COVID-19: Secondary | ICD-10-CM | POA: Insufficient documentation

## 2020-10-04 DIAGNOSIS — F17210 Nicotine dependence, cigarettes, uncomplicated: Secondary | ICD-10-CM | POA: Diagnosis not present

## 2020-10-04 DIAGNOSIS — R109 Unspecified abdominal pain: Secondary | ICD-10-CM | POA: Insufficient documentation

## 2020-10-04 DIAGNOSIS — R509 Fever, unspecified: Secondary | ICD-10-CM | POA: Diagnosis present

## 2020-10-04 LAB — CBC WITH DIFFERENTIAL/PLATELET
Abs Immature Granulocytes: 0.04 10*3/uL (ref 0.00–0.07)
Basophils Absolute: 0 10*3/uL (ref 0.0–0.1)
Basophils Relative: 0 %
Eosinophils Absolute: 0 10*3/uL (ref 0.0–0.5)
Eosinophils Relative: 0 %
HCT: 40.8 % (ref 39.0–52.0)
Hemoglobin: 13.8 g/dL (ref 13.0–17.0)
Immature Granulocytes: 0 %
Lymphocytes Relative: 2 %
Lymphs Abs: 0.2 10*3/uL — ABNORMAL LOW (ref 0.7–4.0)
MCH: 30.7 pg (ref 26.0–34.0)
MCHC: 33.8 g/dL (ref 30.0–36.0)
MCV: 90.9 fL (ref 80.0–100.0)
Monocytes Absolute: 0.7 10*3/uL (ref 0.1–1.0)
Monocytes Relative: 6 %
Neutro Abs: 9.4 10*3/uL — ABNORMAL HIGH (ref 1.7–7.7)
Neutrophils Relative %: 92 %
Platelets: 137 10*3/uL — ABNORMAL LOW (ref 150–400)
RBC: 4.49 MIL/uL (ref 4.22–5.81)
RDW: 13.4 % (ref 11.5–15.5)
WBC: 10.3 10*3/uL (ref 4.0–10.5)
nRBC: 0 % (ref 0.0–0.2)

## 2020-10-04 LAB — COMPREHENSIVE METABOLIC PANEL
ALT: 23 U/L (ref 0–44)
AST: 29 U/L (ref 15–41)
Albumin: 3.9 g/dL (ref 3.5–5.0)
Alkaline Phosphatase: 49 U/L (ref 38–126)
Anion gap: 9 (ref 5–15)
BUN: 8 mg/dL (ref 6–20)
CO2: 26 mmol/L (ref 22–32)
Calcium: 8.7 mg/dL — ABNORMAL LOW (ref 8.9–10.3)
Chloride: 98 mmol/L (ref 98–111)
Creatinine, Ser: 0.7 mg/dL (ref 0.61–1.24)
GFR, Estimated: >60 mL/min (ref >60)
Glucose, Bld: 158 mg/dL — ABNORMAL HIGH (ref 70–99)
Potassium: 3.9 mmol/L (ref 3.5–5.1)
Sodium: 133 mmol/L — ABNORMAL LOW (ref 135–145)
Total Bilirubin: 0.4 mg/dL (ref 0.3–1.2)
Total Protein: 6.7 g/dL (ref 6.5–8.1)

## 2020-10-04 LAB — LIPASE, BLOOD: Lipase: 22 U/L (ref 11–51)

## 2020-10-04 MED ORDER — KETOROLAC TROMETHAMINE 30 MG/ML IJ SOLN
30.0000 mg | Freq: Once | INTRAMUSCULAR | Status: AC
  Administered 2020-10-04: 30 mg via INTRAVENOUS
  Filled 2020-10-04: qty 1

## 2020-10-04 MED ORDER — ONDANSETRON HCL 4 MG/2ML IJ SOLN: 4.0000 mg | Freq: Once | INTRAMUSCULAR | Status: DC

## 2020-10-04 MED ORDER — SODIUM CHLORIDE 0.9 % IV BOLUS
1000.0000 mL | Freq: Once | INTRAVENOUS | Status: AC
  Administered 2020-10-04: 1000 mL via INTRAVENOUS
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
## 2020-10-04 NOTE — ED Triage Notes (Signed)
Patient presents via EMS with complaints of sudden onset NV and fever/chills; states significant other positive for covid. Patient had Zofran 4mg  IV pta per EMS and NS .

## 2020-10-05 LAB — RESP PANEL BY RT-PCR (FLU A&B, COVID) ARPGX2
Influenza A by PCR: NEGATIVE
Influenza B by PCR: NEGATIVE
SARS Coronavirus 2 by RT PCR: POSITIVE — AB

## 2020-10-05 MED ORDER — PROMETHAZINE HCL 25 MG PO TABS: 25.0000 mg | ORAL_TABLET | Freq: Four times a day (QID) | ORAL | 0 refills | Status: DC | PRN

## 2020-10-05 MED ORDER — PROMETHAZINE HCL 25 MG PO TABS: 25.0000 mg | ORAL_TABLET | Freq: Four times a day (QID) | ORAL | 0 refills | Status: AC | PRN

## 2020-10-05 NOTE — ED Provider Notes (Signed)
Patient signed out to me by Dr. Wilkie Aye.  Patient with positive COVID contact presents with febrile illness.  Patient with GI symptoms, benign abdominal exam.  COVID is positive.  He is not in any specific high risk categories, discharged with symptomatic treatment.   Gilda Crease, MD 10/05/20 [phone]

## 2020-10-06 ENCOUNTER — Telehealth (HOSPITAL_COMMUNITY): Payer: Self-pay

## 2020-10-06 NOTE — Telephone Encounter (Signed)
Called to discuss with patient about COVID-19 symptoms and the use of one of the available treatments for those with mild to moderate Covid symptoms and at a high risk of hospitalization.  Pt may appear to qualify for outpatient treatment due to co-morbid conditions and/or a member of an at-risk group in accordance with the FDA Emergency Use Authorization.     Unable to reach pt - Unable to leave voicemail. Attempted pt's brother on chart, CHARVIS LIGHTNER, but also not available and unable to leave VM.   Essie Hart, RN

## 2020-12-14 ENCOUNTER — Emergency Department (HOSPITAL_BASED_OUTPATIENT_CLINIC_OR_DEPARTMENT_OTHER)
Admission: EM | Admit: 2020-12-14 | Discharge: 2020-12-14 | Disposition: A | Discharge status: Home / Self Care | Payer: BC Managed Care – PPO | Attending: Emergency Medicine | Admitting: Emergency Medicine

## 2020-12-14 ENCOUNTER — Encounter (HOSPITAL_BASED_OUTPATIENT_CLINIC_OR_DEPARTMENT_OTHER): Payer: Self-pay

## 2020-12-14 ENCOUNTER — Other Ambulatory Visit (HOSPITAL_BASED_OUTPATIENT_CLINIC_OR_DEPARTMENT_OTHER): Payer: Self-pay

## 2020-12-14 ENCOUNTER — Emergency Department (HOSPITAL_BASED_OUTPATIENT_CLINIC_OR_DEPARTMENT_OTHER): Payer: BC Managed Care – PPO

## 2020-12-14 ENCOUNTER — Other Ambulatory Visit: Payer: Self-pay

## 2020-12-14 DIAGNOSIS — M25572 Pain in left ankle and joints of left foot: Secondary | ICD-10-CM | POA: Diagnosis not present

## 2020-12-14 DIAGNOSIS — F17210 Nicotine dependence, cigarettes, uncomplicated: Secondary | ICD-10-CM | POA: Insufficient documentation

## 2020-12-14 DIAGNOSIS — M25571 Pain in right ankle and joints of right foot: Secondary | ICD-10-CM | POA: Diagnosis present

## 2020-12-14 MED ORDER — KETOROLAC TROMETHAMINE 60 MG/2ML IM SOLN
60.0000 mg | Freq: Once | INTRAMUSCULAR | Status: AC
  Administered 2020-12-14: 60 mg via INTRAMUSCULAR
  Filled 2020-12-14: qty 2

## 2020-12-14 MED ORDER — NAPROXEN 500 MG PO TABS
500.0000 mg | ORAL_TABLET | Freq: Two times a day (BID) | ORAL | 0 refills | Status: AC
  Filled 2020-12-14: qty 14, 7d supply, fill #0

## 2020-12-14 NOTE — ED Notes (Signed)
Pt reports bilateral ankle fx in 2014, reports pain increasing d/t increased standing at employment. Applied heat packs for comfort.

## 2020-12-14 NOTE — ED Notes (Signed)
Pt discharged to home. Discharge instructions have been discussed with patient and/or family members. Pt verbally acknowledges understanding d/c instructions, and endorses comprehension to checkout at registration before leaving.  °

## 2020-12-14 NOTE — Discharge Instructions (Addendum)
You came to the emergency department today to be evaluated for your bilateral ankle pain.  Your x-ray showed no acute broken bones or dislocations.  It did not show any complications with the hardware in your legs.  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  Please follow-up with your orthopedic provider Dr. Ophelia Charter if your pain does not improve.  I have given you prescription for naproxen.  Please take this medication as prescribed.  Please do not combine it with other nonsteroidal anti-inflammatory drugs like ibuprofen or Motrin.  Get help right away if: Your foot, leg, toes, or ankle: Tingles or becomes numb. Becomes swollen. Turns pale or blue.

## 2020-12-14 NOTE — ED Notes (Signed)
ED Provider at bedside. 

## 2020-12-14 NOTE — ED Triage Notes (Signed)
Pt c/o bilateral ankle pain since Thursday. States has metal rods in both ankles. States has been standing and walking more, denies known injury. Hasnt taken pain meds at home. Ambulatory without difficulty to triage
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
## 2021-09-21 ENCOUNTER — Other Ambulatory Visit: Payer: Self-pay | Admitting: Student

## 2021-09-21 DIAGNOSIS — M12571 Traumatic arthropathy, right ankle and foot: Secondary | ICD-10-CM

## 2021-10-12 ENCOUNTER — Ambulatory Visit
Admission: RE | Admit: 2021-10-12 | Discharge: 2021-10-12 | Disposition: A | Discharge status: Home / Self Care | Payer: BC Managed Care – PPO | Source: Ambulatory Visit | Attending: Student | Admitting: Student

## 2021-10-12 DIAGNOSIS — M12571 Traumatic arthropathy, right ankle and foot: Secondary | ICD-10-CM

## 2021-11-09 ENCOUNTER — Other Ambulatory Visit (HOSPITAL_COMMUNITY): Payer: Self-pay | Admitting: Orthopedic Surgery

## 2022-01-05 ENCOUNTER — Ambulatory Visit (HOSPITAL_BASED_OUTPATIENT_CLINIC_OR_DEPARTMENT_OTHER): Admit: 2022-01-05 | Payer: BC Managed Care – PPO | Admitting: Orthopedic Surgery

## 2022-01-05 ENCOUNTER — Encounter (HOSPITAL_BASED_OUTPATIENT_CLINIC_OR_DEPARTMENT_OTHER): Payer: Self-pay

## 2022-01-05 SURGERY — ARTHRODESIS ANKLE
Anesthesia: General | Laterality: Left

## 2022-01-11 ENCOUNTER — Other Ambulatory Visit (HOSPITAL_COMMUNITY): Payer: Self-pay | Admitting: Orthopedic Surgery

## 2022-03-09 ENCOUNTER — Encounter (HOSPITAL_BASED_OUTPATIENT_CLINIC_OR_DEPARTMENT_OTHER): Payer: Self-pay

## 2022-03-09 ENCOUNTER — Ambulatory Visit (HOSPITAL_BASED_OUTPATIENT_CLINIC_OR_DEPARTMENT_OTHER): Admit: 2022-03-09 | Payer: BC Managed Care – PPO | Admitting: Orthopedic Surgery

## 2022-03-09 SURGERY — ARTHRODESIS ANKLE
Anesthesia: General | Site: Ankle | Laterality: Left

## 2022-04-11 ENCOUNTER — Other Ambulatory Visit: Payer: Self-pay

## 2022-04-11 ENCOUNTER — Emergency Department (HOSPITAL_COMMUNITY)
Admission: EM | Admit: 2022-04-11 | Discharge: 2022-04-11 | Disposition: A | Discharge status: Home / Self Care | Payer: BC Managed Care – PPO | Attending: Emergency Medicine | Admitting: Emergency Medicine

## 2022-04-11 ENCOUNTER — Encounter (HOSPITAL_COMMUNITY): Payer: Self-pay

## 2022-04-11 DIAGNOSIS — R051 Acute cough: Secondary | ICD-10-CM | POA: Insufficient documentation

## 2022-04-11 DIAGNOSIS — R0981 Nasal congestion: Secondary | ICD-10-CM

## 2022-04-11 DIAGNOSIS — J069 Acute upper respiratory infection, unspecified: Secondary | ICD-10-CM | POA: Diagnosis not present

## 2022-04-11 DIAGNOSIS — R059 Cough, unspecified: Secondary | ICD-10-CM | POA: Diagnosis present

## 2022-04-11 DIAGNOSIS — Z20822 Contact with and (suspected) exposure to covid-19: Secondary | ICD-10-CM | POA: Insufficient documentation

## 2022-04-11 LAB — SARS CORONAVIRUS 2 BY RT PCR: SARS Coronavirus 2 by RT PCR: NEGATIVE

## 2022-04-11 NOTE — ED Provider Notes (Signed)
[facility]-EMERGENCY DEPT Provider Note   CSN: [id number] Arrival date & time: 04/11/22  4098     History  Chief Complaint  Patient presents with   Cough    MIHCAEL LEDEE is a 31 y.o. male with noncontributory past medical history who presents with concern for upper respiratory infection for the last 5 days.  Patient reports that he has been having some congestion as well as a productive cough.  He denies any fever, chills, nausea, vomiting.  He does report some throat pain.  He has been managing with over-the-counter medications with some improvement.  He denies any chest pain, shortness of breath, diarrhea.   Cough      Home Medications Prior to Admission medications   Medication Sig Start Date End Date Taking? Authorizing Provider  promethazine (PHENERGAN) 25 MG tablet Take 1 tablet (25 mg total) by mouth every 6 (six) hours as needed for nausea or vomiting. 10/05/20   Pollina, Canary Brim, MD      Allergies    Patient has no known allergies.    Review of Systems   Review of Systems  Respiratory:  Positive for cough.   All other systems reviewed and are negative.   Physical Exam Updated Vital Signs BP 132/80   Pulse 61   Temp 99 F (37.2 C) (Oral)   Resp 17   SpO2 98%  Physical Exam Vitals and nursing note reviewed.  Constitutional:      General: He is not in acute distress.    Appearance: Normal appearance.  HENT:     Head: Normocephalic and atraumatic.     Comments: No significant posterior oropharynx erythema, swelling, exudate. Uvula midline, tonsils 1+ bilaterally.  No trismus, stridor, evidence of PTA, floor of mouth swelling or redness.      Nose: Congestion present.  Eyes:     General:        Right eye: No discharge.        Left eye: No discharge.  Cardiovascular:     Rate and Rhythm: Normal rate and regular rhythm.  Pulmonary:     Effort: Pulmonary effort is normal. No respiratory distress.     Comments: Clear breath  sounds bilaterally, no wheezing, rhonchi, stridor, rales Musculoskeletal:        General: No deformity.  Skin:    General: Skin is warm and dry.  Neurological:     Mental Status: He is alert and oriented to person, place, and time.  Psychiatric:        Mood and Affect: Mood normal.        Behavior: Behavior normal.     ED Results / Procedures / Treatments   Labs (all labs ordered are listed, but only abnormal results are displayed) Labs Reviewed  SARS CORONAVIRUS 2 BY RT PCR    EKG None  Radiology No results found.  Procedures Procedures    Medications Ordered in ED Medications - No data to display  ED Course/ Medical Decision Making/ A&P                           Medical Decision Making  This is an overall well-appearing 31 year old male who presents with stable vital signs, normal blood pressure, respiratory rate, oxygen saturation on room air with 5 days of upper respiratory infectious symptoms.  On my exam he has some cough, congestion but is overall well-appearing.  He has clear breath sounds bilaterally, have low  clinical suspicion for developing acute bronchitis or pneumonia.  His oropharynx appears clear, he does not have any significant posterior oropharynx erythema, tonsils are 1+ bilaterally, uvula is midline, no evidence of exudate.  As he has a fairly benign appearance I think it be reasonable to swab him for COVID, and discharged with conservative management of his symptoms, as well as a work note for some rest.  Encourage plenty of fluids, discussed normal expected course of a viral syndrome.  Patient understands agrees to plan, discharged in stable condition at this time. Final Clinical Impression(s) / ED Diagnoses Final diagnoses:  Acute cough  Viral upper respiratory tract infection  Nasal congestion    Rx / DC Orders ED Discharge Orders     None         Anselmo Pickler, PA-C 04/11/22 2707    Margette Fast, MD 04/11/22 1322

## 2022-04-11 NOTE — ED Triage Notes (Signed)
Patient said he thinks he has an upper respiratory infection that has been going on for 5 days. Congestion along with a productive cough.

## 2022-05-21 ENCOUNTER — Other Ambulatory Visit: Payer: Self-pay

## 2022-05-21 ENCOUNTER — Encounter (HOSPITAL_COMMUNITY): Payer: Self-pay | Admitting: *Deleted

## 2022-05-21 ENCOUNTER — Ambulatory Visit (HOSPITAL_COMMUNITY)
Admission: EM | Admit: 2022-05-21 | Discharge: 2022-05-21 | Disposition: A | Discharge status: Home / Self Care | Payer: BC Managed Care – PPO

## 2022-05-21 DIAGNOSIS — K529 Noninfective gastroenteritis and colitis, unspecified: Secondary | ICD-10-CM | POA: Diagnosis not present

## 2022-05-21 NOTE — ED Triage Notes (Signed)
Pt reports he was sick on Thursday and needs a work note for that day. Pt reports Sx's are better.
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
## 2022-05-21 NOTE — Discharge Instructions (Addendum)
I'm glad you are feeling better!  Continue to drink lots of fluids.  Please return with any concerns.

## 2022-07-04 ENCOUNTER — Other Ambulatory Visit (HOSPITAL_COMMUNITY): Payer: Self-pay

## 2023-02-08 ENCOUNTER — Ambulatory Visit (HOSPITAL_COMMUNITY)
Admission: EM | Admit: 2023-02-08 | Discharge: 2023-02-08 | Disposition: A | Discharge status: Home / Self Care | Payer: BC Managed Care – PPO | Attending: Family Medicine | Admitting: Family Medicine

## 2023-02-08 ENCOUNTER — Encounter (HOSPITAL_COMMUNITY): Payer: Self-pay

## 2023-02-08 DIAGNOSIS — G8929 Other chronic pain: Secondary | ICD-10-CM

## 2023-02-08 DIAGNOSIS — M25571 Pain in right ankle and joints of right foot: Secondary | ICD-10-CM | POA: Diagnosis not present

## 2023-02-08 MED ORDER — INDOMETHACIN 50 MG PO CAPS: 50.0000 mg | ORAL_CAPSULE | Freq: Two times a day (BID) | ORAL | 0 refills | Status: DC
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
## 2023-02-08 NOTE — Discharge Instructions (Addendum)
Taken indomethacin make sure that you eat a meal to avoid abdominal upset.  Do not take Motrin, naproxen, or any aspirin containing products while taking this medication.  You may continue Tylenol with indomethacin for pain relief.  If symptoms do not improve only improved mildly follow-up with podiatry office.
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
## 2023-02-19 ENCOUNTER — Other Ambulatory Visit (HOSPITAL_COMMUNITY): Payer: Self-pay

## 2023-02-19 MED ORDER — ASPIRIN 81 MG PO TBEC
81.0000 mg | DELAYED_RELEASE_TABLET | Freq: Two times a day (BID) | ORAL | 0 refills | Status: DC
  Filled 2023-02-19 – 2023-02-20 (×2): qty 120, 60d supply, fill #0

## 2023-02-19 MED ORDER — OXYCODONE HCL 5 MG PO TABS
5.0000 mg | ORAL_TABLET | Freq: Four times a day (QID) | ORAL | 0 refills | Status: DC | PRN
  Filled 2023-02-19 – 2023-02-20 (×2): qty 20, 5d supply, fill #0

## 2023-02-20 ENCOUNTER — Other Ambulatory Visit (HOSPITAL_COMMUNITY): Payer: Self-pay

## 2023-04-11 ENCOUNTER — Emergency Department (HOSPITAL_COMMUNITY)
Admission: EM | Admit: 2023-04-11 | Discharge: 2023-04-12 | Discharge status: Against Medical Advice | Payer: BC Managed Care – PPO | Attending: Emergency Medicine | Admitting: Emergency Medicine

## 2023-04-11 ENCOUNTER — Encounter (HOSPITAL_COMMUNITY): Payer: Self-pay

## 2023-04-11 DIAGNOSIS — U071 COVID-19: Secondary | ICD-10-CM | POA: Diagnosis not present

## 2023-04-11 DIAGNOSIS — Z5321 Procedure and treatment not carried out due to patient leaving prior to being seen by health care provider: Secondary | ICD-10-CM | POA: Diagnosis not present

## 2023-04-11 DIAGNOSIS — R059 Cough, unspecified: Secondary | ICD-10-CM | POA: Diagnosis present

## 2023-04-11 LAB — RESP PANEL BY RT-PCR (RSV, FLU A&B, COVID)  RVPGX2
Influenza A by PCR: NEGATIVE
Influenza B by PCR: NEGATIVE
Resp Syncytial Virus by PCR: NEGATIVE
SARS Coronavirus 2 by RT PCR: POSITIVE — AB

## 2023-04-11 NOTE — ED Notes (Signed)
Patient called for vitals w/ no answer x3. Dragging OTF.

## 2023-04-11 NOTE — ED Triage Notes (Signed)
Pt is coming in for a runny nose, sore throat, head congestion, cough and sneezing. He believe he may have coivd or the flu. He also wants his foot looked at to make sure his walking boot is on properly. He has no other complaints at this time and is cooperative in triage.

## 2023-09-24 ENCOUNTER — Encounter: Payer: Self-pay | Admitting: *Deleted
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
## 2023-11-02 ENCOUNTER — Other Ambulatory Visit: Payer: Self-pay

## 2023-11-02 ENCOUNTER — Encounter (HOSPITAL_COMMUNITY): Payer: Self-pay

## 2023-11-02 ENCOUNTER — Emergency Department (HOSPITAL_COMMUNITY): Payer: MEDICAID

## 2023-11-02 ENCOUNTER — Emergency Department (HOSPITAL_COMMUNITY)
Admission: EM | Admit: 2023-11-02 | Discharge: 2023-11-02 | Disposition: A | Discharge status: Home / Self Care | Payer: MEDICAID

## 2023-11-02 DIAGNOSIS — M25572 Pain in left ankle and joints of left foot: Secondary | ICD-10-CM | POA: Insufficient documentation

## 2023-11-02 DIAGNOSIS — G8929 Other chronic pain: Secondary | ICD-10-CM | POA: Diagnosis not present

## 2023-11-02 DIAGNOSIS — Z7982 Long term (current) use of aspirin: Secondary | ICD-10-CM | POA: Insufficient documentation

## 2023-11-02 MED ORDER — NAPROXEN 500 MG PO TABS: 500.0000 mg | ORAL_TABLET | Freq: Two times a day (BID) | ORAL | 0 refills | Status: DC

## 2023-11-02 MED ORDER — HYDROCODONE-ACETAMINOPHEN 5-325 MG PO TABS: 1.0000 | ORAL_TABLET | Freq: Four times a day (QID) | ORAL | 0 refills | Status: DC | PRN

## 2023-11-02 MED ORDER — HYDROCODONE-ACETAMINOPHEN 5-325 MG PO TABS
1.0000 | ORAL_TABLET | Freq: Once | ORAL | Status: AC
  Administered 2023-11-02: 1 via ORAL
  Filled 2023-11-02: qty 1

## 2023-11-02 MED ORDER — KETOROLAC TROMETHAMINE 15 MG/ML IJ SOLN
15.0000 mg | Freq: Once | INTRAMUSCULAR | Status: AC
  Administered 2023-11-02: 15 mg via INTRAMUSCULAR
  Filled 2023-11-02: qty 1

## 2023-11-02 NOTE — ED Triage Notes (Signed)
 Pt here for bilateral ankle pain and muscle spasms. Pt states he needs a cane. Pt states left ankle is swollen.

## 2023-11-02 NOTE — ED Provider Triage Note (Signed)
 Emergency Medicine Provider Triage Evaluation Note  George Harrison , a 33 y.o. male  was evaluated in triage.  Pt complains of left ankle pain since December.  Patient states that he had surgery on his left ankle and they put a plate in and since that has had pain.  Patient states that hurts to walk and that he would like crutches.  Patient states that tried foot and ankle did his surgery and at this time he is not taking any medications for his ankle pain.  Patient Nuys any new traumas.  Patient states he cannot move his toes however this is chronic from after the surgery and is not new.  Patient is still feel his toes.  Patient denies any fevers.  Review of Systems  Positive:  Negative:   Physical Exam  BP 118/82   Pulse 60   Temp 97.9 F (36.6 C)   Resp 18   Ht 5\' 11"  (1.803 m)   Wt 64 kg   SpO2 97%   BMI 19.67 kg/m  Gen:   Awake, no distress   Resp:  Normal effort  MSK:   Moves extremities without difficulty  Other:  Sensation intact distally, does not wiggle toes however states this is chronic, cap refill less than 2 seconds, no signs erythema or edema or warmth, postop scar present  Medical Decision Making  Medically screening exam initiated at 12:19 PM.  Appropriate orders placed.  Dania Dupre was informed that the remainder of the evaluation will be completed by another provider, this initial triage assessment does not replace that evaluation, and the importance of remaining in the ED until their evaluation is complete.  Workup initiated, patient stable.   Denese Finn, PA-C 11/02/23 1220
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
## 2023-11-02 NOTE — Discharge Instructions (Addendum)
 I would recommend wearing Ace bandage in the evening for swelling.  Use ASO ankle brace during the day when you are on your feet.  Use cane when walking.  Try and manage pain with Tylenol  and ibuprofen  as well as ice.  If you have breakthrough pain then you can take Norco as needed. Call orthopedics to follow-up with.

## 2024-01-10 ENCOUNTER — Encounter: Payer: Self-pay | Admitting: *Deleted

## 2024-01-10 ENCOUNTER — Encounter: Payer: Self-pay | Admitting: Family Medicine
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
## 2024-01-12 ENCOUNTER — Emergency Department (HOSPITAL_COMMUNITY)
Admission: EM | Admit: 2024-01-12 | Discharge: 2024-01-12 | Disposition: A | Discharge status: Home / Self Care | Payer: MEDICAID | Attending: Emergency Medicine | Admitting: Emergency Medicine

## 2024-01-12 ENCOUNTER — Encounter (HOSPITAL_COMMUNITY): Payer: Self-pay | Admitting: *Deleted

## 2024-01-12 ENCOUNTER — Emergency Department (HOSPITAL_COMMUNITY): Payer: MEDICAID

## 2024-01-12 DIAGNOSIS — G8929 Other chronic pain: Secondary | ICD-10-CM | POA: Insufficient documentation

## 2024-01-12 DIAGNOSIS — M25572 Pain in left ankle and joints of left foot: Secondary | ICD-10-CM | POA: Diagnosis present

## 2024-01-12 MED ORDER — OXYCODONE HCL 5 MG PO TABS
5.0000 mg | ORAL_TABLET | Freq: Once | ORAL | Status: AC
  Administered 2024-01-12: 5 mg via ORAL
  Filled 2024-01-12: qty 1

## 2024-01-12 MED ORDER — MELOXICAM 7.5 MG PO TABS: 7.5000 mg | ORAL_TABLET | Freq: Every day | ORAL | 0 refills | Status: AC

## 2024-01-12 MED ORDER — ACETAMINOPHEN 325 MG PO TABS
650.0000 mg | ORAL_TABLET | Freq: Once | ORAL | Status: AC
  Administered 2024-01-12: 650 mg via ORAL
  Filled 2024-01-12: qty 2

## 2024-01-12 MED ORDER — OXYCODONE HCL 5 MG PO TABS: 5.0000 mg | ORAL_TABLET | ORAL | 0 refills | Status: AC | PRN

## 2024-01-12 MED ORDER — LIDOCAINE 5 % EX PTCH: 1.0000 | MEDICATED_PATCH | CUTANEOUS | 0 refills | Status: AC

## 2024-01-12 MED ORDER — KETOROLAC TROMETHAMINE 30 MG/ML IJ SOLN
30.0000 mg | Freq: Once | INTRAMUSCULAR | Status: AC
  Administered 2024-01-12: 30 mg via INTRAMUSCULAR
  Filled 2024-01-12: qty 1

## 2024-01-12 NOTE — ED Triage Notes (Signed)
 The pt reports pain in his lt ankle arthritis  and he is also c/o his rt ankle where he slipped on grass  3-4.  He is homeless  he just got a job and he's on his feet and he wants something for pain

## 2024-01-12 NOTE — ED Provider Notes (Signed)
 [place] EMERGENCY DEPARTMENT AT [facility] Provider Note   CSN: [id number] Arrival date & time: 01/12/24  2115     Patient presents with: Ankle Pain   George Harrison is a 33 y.o. male here for left ankle pain. Hx of prior surgery and chronic pain to left ankle. 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 Has noticed that his left ankle has been hurting more than normal. Cannot wear his ankle brace because it causes more pain to the ankle. Intermittent swelling to the ankle which is chronic. No fever, redness, warmth, rashes, lesions, numbness, weakness. Has been able to ambulate. Denies new injury. No pain to foot mid/proximal tib/fib   HPI     Prior to Admission medications   Medication Sig Start Date End Date Taking? Authorizing Provider  lidocaine  (LIDODERM ) 5 % Place 1 patch onto the skin daily. Remove & Discard patch within 12 hours or as directed by MD 01/12/24  Yes Princesa Willig A, PA-C  meloxicam  (MOBIC ) 7.5 MG tablet Take 1 tablet (7.5 mg total) by mouth daily. 01/12/24  Yes Madisan Bice A, PA-C  oxyCODONE  (ROXICODONE ) 5 MG immediate release tablet Take 1 tablet (5 mg total) by mouth every 4 (four) hours as needed for severe pain (pain score 7-10). 01/12/24  Yes Joyleen Haselton A, PA-C  promethazine  (PHENERGAN ) 25 MG tablet Take 1 tablet (25 mg total) by mouth every 6 (six) hours as needed for nausea or vomiting. 10/05/20   Pollina, Lonni PARAS, MD    Allergies: Patient has no known allergies.    Review of Systems  Constitutional:  Negative for fatigue.  HENT: Negative.    Respiratory: Negative.    Cardiovascular: Negative.   Gastrointestinal: Negative.   Genitourinary: Negative.   Musculoskeletal:        Left ankle pain  Skin: Negative.   Neurological: Negative.   All other systems reviewed and are negative.  Updated Vital Signs BP 130/86 (BP Location: Right Arm)   Pulse 96   Temp 97.9 F (36.6 C) (Oral)   Resp 16   Ht 5' 11 (1.803 m)    Wt 64 kg   SpO2 92%   BMI 19.68 kg/m   Physical Exam Vitals and nursing note reviewed.  Constitutional:      General: He is not in acute distress.    Appearance: He is well-developed. He is not ill-appearing or diaphoretic.  HENT:     Head: Atraumatic.  Eyes:     Pupils: Pupils are equal, round, and reactive to light.  Cardiovascular:     Rate and Rhythm: Normal rate and regular rhythm.     Pulses: Normal pulses.          Radial pulses are 2+ on the right side and 2+ on the left side.       Dorsalis pedis pulses are 2+ on the right side and 2+ on the left side.  Pulmonary:     Effort: Pulmonary effort is normal. No respiratory distress.  Abdominal:     General: There is no distension.     Palpations: Abdomen is soft.  Musculoskeletal:        General: Normal range of motion.     Cervical back: Normal range of motion and neck supple.     Comments: Old midline scarring left ankle. C/D/I. Diffuse tenderness about ankle wo edema, erythema or warmth.  Nontender foot, midshaft, proximal tib-fib.  Able to flex and extend without difficulty.  Calf soft, nontender.  Compartments soft.  Skin:    General: Skin is warm and dry.     Capillary Refill: Capillary refill takes less than 2 seconds.     Comments: No edema, erythema, warmth, rashes, lesions, fluctuance, induration, skin breakdown/ ulcerations  Neurological:     General: No focal deficit present.     Mental Status: He is alert and oriented to person, place, and time.     Sensory: Sensation is intact.     Motor: Motor function is intact.     Gait: Gait is intact.     (all labs ordered are listed, but only abnormal results are displayed) Labs Reviewed - No data to display  EKG: None  Radiology: DG Ankle Complete Left Result Date: 01/12/2024 EXAM: 3 or more VIEW(S) XRAY OF THE LEFT ANKLE 01/12/2024 10:28:00 PM CLINICAL HISTORY: Pain. The patient reports pain in his left ankle arthritis and he is also complaining of his right  ankle where he slipped on grass 3-4 days ago. He is homeless, he just got a job and he's on his feet and he wants something for pain. COMPARISON: Radiographs made 01/02/2024. FINDINGS: BONES AND JOINTS: No acute fracture or dislocation. Chronic changes about the medial malleolus from healed fracture. No significant interval change in the hardware about the distal tibia and talus with tibiotalar arthrodesis. Heterotopic ossification about the distal tibia. Similar joint space loss with osteophyte formation at the tibiotalar joint. SOFT TISSUES: The soft tissues are unremarkable. IMPRESSION: 1. No acute osseous abnormality. 2. No significant interval change in the hardware about the distal tibia and talus with tibiotalar arthrodesis. 3. Advanced degenerative changes about the left ankle. Electronically signed by: Norman Gatlin MD 01/12/2024 10:38 PM EDT RP Workstation: [id number]     Procedures   Medications Ordered in the ED  acetaminophen  (TYLENOL ) tablet 650 mg (650 mg Oral Given 01/12/24 2131)  oxyCODONE  (Oxy IR/ROXICODONE ) immediate release tablet 5 mg (5 mg Oral Given 01/12/24 2212)  ketorolac  (TORADOL ) 30 MG/ML injection 30 mg (30 mg Intramuscular Given 01/12/24 2862)   33 year old here for evaluation of acute on chronic left ankle pain.  Prior surgical intervention to the left ankle.  Recently started a new job where he is standing for many hours a day.  Has had continued pain to his left ankle.  Here he is neurovascularly tact.  His compartments are soft.  His old incision is C/D/I.  He has no edema, erythema, warmth or obvious joint effusion to suggest septic joint, gout, hemarthrosis.  Will plan on checking x-ray to ensure hardware still in correct position.   Imaging personally viewed and interpreted:  X-ray left ankle without acute significant findings   Suspect likely acute on chronic pain.  Urged him to establish care with an orthopedist as he has been seen for this issue previously.  Will  write for some pain management meantime.  Low suspicion for septic joint, hemarthrosis, gout, occult fracture, dislocation, necrotizing infection, VTE, ischemia, hardware complication, compartment syndrome.   The patient has been appropriately medically screened and/or stabilized in the ED. I have low suspicion for any other emergent medical condition which would require further screening, evaluation or treatment in the ED or require inpatient management.  Patient is hemodynamically stable and in no acute distress.  Patient able to ambulate in department prior to ED.  Evaluation does not show acute pathology that would require ongoing or additional emergent interventions while in the emergency department or further inpatient treatment.  I have discussed the diagnosis with  the patient and answered all questions.  Pain is been managed while in the emergency department and patient has no further complaints prior to discharge.  Patient is comfortable with plan discussed in room and is stable for discharge at this time.  I have discussed strict return precautions for returning to the emergency department.  Patient was encouraged to follow-up with PCP/specialist refer to at discharge.                                    Medical Decision Making Amount and/or Complexity of Data Reviewed External Data Reviewed: labs, radiology and notes. Radiology: ordered and independent interpretation performed. Decision-making details documented in ED Course.  Risk OTC drugs. Prescription drug management. Decision regarding hospitalization. Diagnosis or treatment significantly limited by social determinants of health.       Final diagnoses:  Chronic pain of left ankle    ED Discharge Orders          Ordered    oxyCODONE  (ROXICODONE ) 5 MG immediate release tablet  Every 4 hours PRN        01/12/24 2244    lidocaine  (LIDODERM ) 5 %  Every 24 hours        01/12/24 2244    meloxicam  (MOBIC ) 7.5 MG tablet   Daily        01/12/24 2244               Garlen Reinig A, PA-C 01/12/24 2252    Dreama Longs, MD 01/14/24 1221

## 2024-01-12 NOTE — Discharge Instructions (Signed)
 I have written you for a few medications to help with your symptoms  Mobic -this is an anti-inflammatory do not take other anti-inflammatories such as ibuprofen  or Aleve  while taking  Lidocaine  patches, place on ankle, wear for 12 hours  Roxicodone --this is a controlled substance.  Do not drive or operate heavy machinery while taking this medication  Make sure to establish care with an orthopedist.  Return for any worsening symptoms

## 2024-01-12 NOTE — ED Notes (Signed)
 Patient transported to x-ray. ?

## 2024-02-05 ENCOUNTER — Ambulatory Visit: Payer: MEDICAID

## 2024-03-08 ENCOUNTER — Other Ambulatory Visit: Payer: Self-pay

## 2024-03-08 ENCOUNTER — Encounter (HOSPITAL_COMMUNITY): Payer: Self-pay | Admitting: Pharmacy Technician

## 2024-03-08 ENCOUNTER — Emergency Department (HOSPITAL_COMMUNITY)
Admission: EM | Admit: 2024-03-08 | Discharge: 2024-03-08 | Disposition: A | Discharge status: Home / Self Care | Payer: MEDICAID | Attending: Emergency Medicine | Admitting: Emergency Medicine

## 2024-03-08 DIAGNOSIS — K047 Periapical abscess without sinus: Secondary | ICD-10-CM | POA: Diagnosis not present

## 2024-03-08 DIAGNOSIS — R059 Cough, unspecified: Secondary | ICD-10-CM | POA: Insufficient documentation

## 2024-03-08 DIAGNOSIS — K0889 Other specified disorders of teeth and supporting structures: Secondary | ICD-10-CM

## 2024-03-08 LAB — RESP PANEL BY RT-PCR (RSV, FLU A&B, COVID)  RVPGX2
Influenza A by PCR: NEGATIVE
Influenza B by PCR: NEGATIVE
Resp Syncytial Virus by PCR: NEGATIVE
SARS Coronavirus 2 by RT PCR: NEGATIVE

## 2024-03-08 MED ORDER — AMOXICILLIN-POT CLAVULANATE 875-125 MG PO TABS: 1.0000 | ORAL_TABLET | Freq: Two times a day (BID) | ORAL | 0 refills | Status: AC

## 2024-03-08 MED ORDER — KETOROLAC TROMETHAMINE 30 MG/ML IJ SOLN
30.0000 mg | Freq: Once | INTRAMUSCULAR | Status: AC
  Administered 2024-03-08: 30 mg via INTRAMUSCULAR
  Filled 2024-03-08: qty 1

## 2024-03-08 MED ORDER — AMOXICILLIN-POT CLAVULANATE 875-125 MG PO TABS
1.0000 | ORAL_TABLET | Freq: Once | ORAL | Status: AC
  Administered 2024-03-08: 1 via ORAL
  Filled 2024-03-08: qty 1

## 2024-03-08 NOTE — ED Triage Notes (Signed)
 Pt reports cough for the last few days, gradually worsening. Also complains of dental pain.

## 2024-03-08 NOTE — ED Provider Notes (Signed)
 [place] EMERGENCY DEPARTMENT AT [facility] Provider Note   CSN: [id number] Arrival date & time: 03/08/24  1526     Patient presents with: Cough   George Harrison is a 33 y.o. male presenting to ED about 3 days of cough and congestion, mild sore throat, productive phlegm with a little bit of blood, also reporting dental pain.   HPI     Prior to Admission medications   Medication Sig Start Date End Date Taking? Authorizing Provider  amoxicillin-clavulanate (AUGMENTIN) 875-125 MG tablet Take 1 tablet by mouth every 12 (twelve) hours for 7 days. 03/08/24 03/15/24 Yes Nancylee Gaines, Donnice PARAS, MD  lidocaine  (LIDODERM ) 5 % Place 1 patch onto the skin daily. Remove & Discard patch within 12 hours or as directed by MD 01/12/24   Henderly, Britni A, PA-C  meloxicam  (MOBIC ) 7.5 MG tablet Take 1 tablet (7.5 mg total) by mouth daily. 01/12/24   Henderly, Britni A, PA-C  oxyCODONE  (ROXICODONE ) 5 MG immediate release tablet Take 1 tablet (5 mg total) by mouth every 4 (four) hours as needed for severe pain (pain score 7-10). 01/12/24   Henderly, Britni A, PA-C  promethazine  (PHENERGAN ) 25 MG tablet Take 1 tablet (25 mg total) by mouth every 6 (six) hours as needed for nausea or vomiting. 10/05/20   Pollina, Lonni PARAS, MD    Allergies: Patient has no known allergies.    Review of Systems  Updated Vital Signs BP 116/80 (BP Location: Right Arm)   Pulse 93   Temp 98 F (36.7 C)   Resp 18   SpO2 95%   Physical Exam Constitutional:      General: He is not in acute distress. HENT:     Head: Normocephalic and atraumatic.     Comments: Poor dentition and gum discoloration of the upper and lower gumline, lower wisdom teeth appear impacted partially on both sides Eyes:     Conjunctiva/sclera: Conjunctivae normal.     Pupils: Pupils are equal, round, and reactive to light.  Cardiovascular:     Rate and Rhythm: Normal rate and regular rhythm.  Pulmonary:     Effort: Pulmonary effort is normal.  No respiratory distress.     Comments: No audible wheezing, there are rhonchi in the right middle and lower lung field Abdominal:     General: There is no distension.     Tenderness: There is no abdominal tenderness.  Skin:    General: Skin is warm and dry.  Neurological:     General: No focal deficit present.     Mental Status: He is alert. Mental status is at baseline.  Psychiatric:        Mood and Affect: Mood normal.        Behavior: Behavior normal.     (all labs ordered are listed, but only abnormal results are displayed) Labs Reviewed  RESP PANEL BY RT-PCR (RSV, FLU A&B, COVID)  [id number]    EKG: None  Radiology: No results found.   Procedures   Medications Ordered in the ED  ketorolac  (TORADOL ) 30 MG/ML injection 30 mg (has no administration in time range)  amoxicillin-clavulanate (AUGMENTIN) 875-125 MG per tablet 1 tablet (has no administration in time range)                                    Medical Decision Making Amount and/or Complexity of Data Reviewed Radiology: ordered.  Risk Prescription  drug management.   Patient is here with potential dental infection as well as productive cough for 2 to 3 days.  I started him on Augmentin for cross coverage for both dental infection as well as potential pneumonia.  He is not hypoxic, saturation is normal, and he is not in respiratory distress.  I do not believe he is requiring blood testing or hospitalization.  He did have a viral swab performed at triage for which he can follow-up.  Low suspicion for PE, sepsis, or emergent dental infection, but I did encourage him to schedule an outpatient appointment with a dentist given my concerns for potential gingivitis and cavities.     Final diagnoses:  Pain, dental  Cough, unspecified type    ED Discharge Orders          Ordered    amoxicillin-clavulanate (AUGMENTIN) 875-125 MG tablet  Every 12 hours        03/08/24 1626               Cottie Donnice PARAS,  MD 03/08/24 [phone]
